# Patient Record
Sex: Female | Born: 1985 | Race: Black or African American | Hispanic: No | Marital: Single | Smoking: Current every day smoker
Health system: Southern US, Community
[De-identification: ages and names within clinical notes are randomized; demographics above are authoritative.]

## PROBLEM LIST (undated history)

## (undated) DIAGNOSIS — Z8742 Personal history of other diseases of the female genital tract: Secondary | ICD-10-CM

## (undated) HISTORY — PX: NO PAST SURGERIES: SHX2092

## (undated) HISTORY — DX: Personal history of other diseases of the female genital tract: Z87.42

## (undated) HISTORY — PX: CERVICAL BIOPSY  W/ LOOP ELECTRODE EXCISION: SUR135

---

## 2015-09-03 ENCOUNTER — Ambulatory Visit (INDEPENDENT_AMBULATORY_CARE_PROVIDER_SITE_OTHER): Payer: 59 | Admitting: Physician Assistant

## 2015-09-03 VITALS — BP 130/74 | HR 84 | Temp 98.4°F | Resp 18 | Ht 62.5 in | Wt 224.0 lb

## 2015-09-03 DIAGNOSIS — H6981 Other specified disorders of Eustachian tube, right ear: Secondary | ICD-10-CM

## 2015-09-03 DIAGNOSIS — L309 Dermatitis, unspecified: Secondary | ICD-10-CM

## 2015-09-03 MED ORDER — AZELASTINE HCL 0.1 % NA SOLN: 2.0000 | Freq: Two times a day (BID) | NASAL | 0 refills | Status: DC

## 2015-09-03 MED ORDER — TRIAMCINOLONE ACETONIDE 0.1 % EX CREA: 1.0000 "application " | TOPICAL_CREAM | Freq: Two times a day (BID) | CUTANEOUS | 0 refills | Status: DC

## 2015-09-03 NOTE — Patient Instructions (Addendum)
Take an oral antihistamine, like Claritin, Zyrtec or Allegra, each day. Stay as cool and dry as you can! Getting hot and sweaty will make the itching worse!    IF you received an x-ray today, you will receive an invoice from [facility]. Please contact [facility] at [phone] with questions or concerns regarding your invoice.   IF you received labwork today, you will receive an invoice from [employer]. Please contact [facility] at [phone] with questions or concerns regarding your invoice.   Our billing staff will not be able to assist you with questions regarding bills from these companies.  You will be contacted with the lab results as soon as they are available. The fastest way to get your results is to activate your My Chart account. Instructions are located on the last page of this paperwork. If you have not heard from [place] regarding the results in 2 weeks, please contact this office.

## 2015-09-03 NOTE — Progress Notes (Signed)
Patient ID: Donna Harris, female     DOB: [birth year], 30 y.o.    MRN: [id number]  PCP: No primary care provider on file.  Chief Complaint  Patient presents with  . Ear Pain    feel like has something in it x 5 days    Subjective:    HPI  Presents for evaluation of sensation of FB in RIGHT ear x 7 days.  In addition, she has some bumps on the RIGHT foot/leg.  No ear pain, but has pressure. Has some nasal congestion, runny nose, cough (non-productive). Throat feels uncomfortable. Talking louder, per others. No fever, chills. No nausea since her menstrual bleed (nausea is a common symptom for her with menses). No coughing, headache, dizziness.  Noticed the bumps on her feet and legs on 7/31 after spending the weekend camping. No one else developed bumps. Very itchy. Has tried no treatments.   Prior to Admission medications   Medication Sig Start Date End Date Taking? Authorizing Provider  ibuprofen (ADVIL,MOTRIN) 800 MG tablet Take 800 mg by mouth every 8 (eight) hours as needed for cramping.   Yes Historical Provider, MD     Allergies  Allergen Reactions  . Morphine And Related      There are no active problems to display for this patient.    Family History  Problem Relation Age of Onset  . Cancer Mother   . Hyperlipidemia Mother   . Hypertension Mother   . Mental illness Mother   . Mental illness Sister   . Hypertension Sister   . Hyperlipidemia Sister   . Cancer Sister   . Mental illness Brother      Social History   Social History  . Marital status: Single    Spouse name: n/a  . Number of children: 0  . Years of education: college   Occupational History  . car Biomedical engineer   Social History Main Topics  . Smoking status: Current Every Day Smoker    Packs/day: 0.50    Types: Cigarettes  . Smokeless tobacco: Never Used  . Alcohol use Yes     Comment: once a month or less  . Drug use: No  . Sexual activity: Yes    Partners:  Male    Birth control/ protection: None     Comment: desires pregnancy   Other Topics Concern  . Not on file   Social History Narrative   College degree in Investment banker, corporate from [employer].   Lives alone.   Family lives in [place].        Review of Systems       Objective:  Physical Exam  Constitutional: She is oriented to person, place, and time. She appears well-developed and well-nourished. She is active and cooperative. No distress.  BP 130/74 (BP Location: Right Arm, Patient Position: Sitting, Cuff Size: Large)   Pulse 84   Temp 98.4 F (36.9 C) (Oral)   Resp 18   Ht 5' 2.5" (1.588 m)   Wt 224 lb (101.6 kg)   LMP 08/28/2015 (Exact Date)   SpO2 99%   BMI 40.32 kg/m   HENT:  Head: Normocephalic and atraumatic.  Right Ear: Hearing, tympanic membrane, external ear and ear canal normal.  Left Ear: Hearing, tympanic membrane, external ear and ear canal normal.  Nose: Mucosal edema present. No rhinorrhea. Right sinus exhibits no maxillary sinus tenderness and no frontal sinus tenderness. Left sinus exhibits no maxillary sinus tenderness and no  frontal sinus tenderness.  Mouth/Throat: Oropharynx is clear and moist and mucous membranes are normal. No oral lesions. Normal dentition. No oropharyngeal exudate.  Eyes: Conjunctivae are normal. No scleral icterus.  Neck: Normal range of motion. Neck supple. No thyromegaly present.  Cardiovascular: Normal rate, regular rhythm and normal heart sounds.   Pulses:      Radial pulses are 2+ on the right side, and 2+ on the left side.  Pulmonary/Chest: Effort normal and breath sounds normal.  Lymphadenopathy:       Head (right side): No tonsillar, no preauricular, no posterior auricular and no occipital adenopathy present.       Head (left side): No tonsillar, no preauricular, no posterior auricular and no occipital adenopathy present.    She has no cervical adenopathy.       Right: No supraclavicular adenopathy present.        Left: No supraclavicular adenopathy present.  Neurological: She is alert and oriented to person, place, and time. No sensory deficit.  Skin: Skin is warm, dry and intact. Lesion (scattered small erythematous papules, some excoriated, scattered on the feet and lower legs) noted. No rash noted. No cyanosis. Nails show no clubbing.  Psychiatric: She has a normal mood and affect. Her speech is normal and behavior is normal.             Assessment & Plan:  1. ETD (eustachian tube dysfunction), right Supportive care. Anticipatory guidance. OTC oral antihistamine + nasal spray. - azelastine (ASTELIN) 0.1 % nasal spray; Place 2 sprays into both nostrils 2 (two) times daily. Use in each nostril as directed  Dispense: 30 mL; Refill: 0  2. Dermatitis Likely bug bites, or contact dermatitis to plants. Supportive care. Anticipatory guidance. OTC oral antihistamine. Stay cool and dry. - triamcinolone cream (KENALOG) 0.1 %; Apply 1 application topically 2 (two) times daily.  Dispense: 30 g; Refill: 0   Fernande Bras, PA-C Physician Assistant-Certified [facility]

## 2015-09-10 ENCOUNTER — Emergency Department (HOSPITAL_COMMUNITY)
Admission: EM | Admit: 2015-09-10 | Discharge: 2015-09-10 | Disposition: A | Discharge status: Home / Self Care | Payer: Self-pay | Attending: Emergency Medicine | Admitting: Emergency Medicine

## 2015-09-10 ENCOUNTER — Encounter (HOSPITAL_COMMUNITY): Payer: Self-pay | Admitting: *Deleted

## 2015-09-10 DIAGNOSIS — Z791 Long term (current) use of non-steroidal anti-inflammatories (NSAID): Secondary | ICD-10-CM | POA: Insufficient documentation

## 2015-09-10 DIAGNOSIS — F17210 Nicotine dependence, cigarettes, uncomplicated: Secondary | ICD-10-CM | POA: Insufficient documentation

## 2015-09-10 DIAGNOSIS — J0110 Acute frontal sinusitis, unspecified: Secondary | ICD-10-CM | POA: Insufficient documentation

## 2015-09-10 MED ORDER — AMOXICILLIN-POT CLAVULANATE 875-125 MG PO TABS: 1.0000 | ORAL_TABLET | Freq: Two times a day (BID) | ORAL | 0 refills | Status: DC

## 2015-09-10 MED ORDER — OXYMETAZOLINE HCL 0.05 % NA SOLN: 1.0000 | Freq: Two times a day (BID) | NASAL | 0 refills | Status: DC

## 2015-09-10 MED ORDER — PSEUDOEPHEDRINE HCL 60 MG PO TABS: 60.0000 mg | ORAL_TABLET | Freq: Four times a day (QID) | ORAL | 0 refills | Status: DC | PRN

## 2015-09-10 NOTE — Discharge Instructions (Signed)
Take your medications as prescribed. Please follow up with a primary care provider from the Resource Guide provided below in 1 week if your symptoms have not improved. Please return to the Emergency Department if symptoms worsen or new onset of fever, headache, neck stiffness, visual changes, lightheadedness, dizziness, loss of hearing, difficulty breathing.

## 2015-09-10 NOTE — ED Triage Notes (Addendum)
Patient c/o nasal congestion and right ear pain x4 weeks.  [facility] nasal spray, which she states she has been using with no relief.  Patient also c/o cough, watery eyes, sinus pain and post-nasal drip.  Patient's sclera bilaterally injected.

## 2015-09-10 NOTE — ED Provider Notes (Signed)
[facility]-EMERGENCY DEPT Provider Note   CSN: [id number] Arrival date & time: 09/10/15  1620  First Provider Contact:  None    By signing my name below, I, Majel HomerPeyton Lee, attest that this documentation has been prepared under the direction and in the presence of non-physician practitioner, Melburn HakeNicole Ilhan Debenedetto, PA-C. Electronically Signed: Majel HomerPeyton Lee, Scribe. 09/10/2015. 5:47 PM.  History   Chief Complaint Chief Complaint  Patient presents with  . Otalgia  . Nasal Congestion   The history is provided by the patient. No language interpreter was used.    HPI Comments: Donna Harris is a 30 y.o. female who presents to the Emergency Department complaining of gradually worsening, right ear pain and nasal congestion that began 3.5 weeks ago and worsened today. Pt describes hearing a constant "swooshing" sound in her right ear. Pt notes she visited [facility] last week in which she was told she may have an eustachian tube dysfunction. She states she was prescribed nasal spray with no relief. She reports associated muffled hearing, nasal congestion, sinus pressure, productive cough, postnasal drip. She denies left ear pain, drainage from her right ear, rhinorrhea, pain or discharge around her eyes, chest pain, SOB, wheezing, abdominal pain, nausea, vomiting, neck stiffness or fever.   Past Medical History:  Diagnosis Date  . History of abnormal cervical Pap smear    Patient Active Problem List   Diagnosis Date Noted  . Severe obesity (BMI >= 40) (HCC) 09/03/2015   Past Surgical History:  Procedure Laterality Date  . NO PAST SURGERIES      OB History    No data available     Home Medications    Prior to Admission medications   Medication Sig Start Date End Date Taking? Authorizing Provider  amoxicillin-clavulanate (AUGMENTIN) 875-125 MG tablet Take 1 tablet by mouth every 12 (twelve) hours. 09/10/15   Barrett HenleNicole Elizabeth Kairav Russomanno, PA-C  azelastine (ASTELIN) 0.1 % nasal spray Place 2 sprays into both nostrils 2  (two) times daily. Use in each nostril as directed 09/03/15   Chelle Jeffery, PA-C  ibuprofen (ADVIL,MOTRIN) 800 MG tablet Take 800 mg by mouth every 8 (eight) hours as needed for cramping.    Historical Provider, MD  oxymetazoline (AFRIN NASAL SPRAY) 0.05 % nasal spray Place 1 spray into both nostrils 2 (two) times daily. 09/10/15   Barrett HenleNicole Elizabeth Kaitlynne Wenz, PA-C  pseudoephedrine (SUDAFED) 60 MG tablet Take 1 tablet (60 mg total) by mouth every 6 (six) hours as needed for congestion. 09/10/15   Barrett HenleNicole Elizabeth Tyrie Porzio, PA-C  triamcinolone cream (KENALOG) 0.1 % Apply 1 application topically 2 (two) times daily. 09/03/15   Porfirio Oarhelle Jeffery, PA-C   Family History Family History  Problem Relation Age of Onset  . Cancer Mother   . Hyperlipidemia Mother   . Hypertension Mother   . Mental illness Mother   . Mental illness Sister   . Hypertension Sister   . Hyperlipidemia Sister   . Cancer Sister   . Mental illness Brother     Social History Social History  Substance Use Topics  . Smoking status: Current Every Day Smoker    Packs/day: 0.50    Types: Cigarettes  . Smokeless tobacco: Never Used  . Alcohol use Yes     Comment: once a month or less   Allergies   Morphine and related  Review of Systems Review of Systems  Constitutional: Negative for fever.  HENT: Positive for congestion, ear pain (right), hearing loss, postnasal drip and sinus pressure. Negative for ear  discharge, rhinorrhea and sore throat.   Eyes: Negative for pain, discharge and redness.  Respiratory: Positive for cough. Negative for wheezing.   Cardiovascular: Negative for chest pain.  Gastrointestinal: Negative for abdominal pain, nausea and vomiting.   Physical Exam Updated Vital Signs BP 133/83 (BP Location: Left Arm)   Pulse 107   Temp 98.5 F (36.9 C) (Oral)   Resp 18   LMP 08/28/2015 (Exact Date)   SpO2 100%   Physical Exam  Constitutional: She is oriented to person, place, and time. She appears well-developed  and well-nourished.  HENT:  Head: Normocephalic and atraumatic.  Right Ear: A middle ear effusion is present.  Left Ear: Tympanic membrane normal.  No middle ear effusion.  Nose: Right sinus exhibits maxillary sinus tenderness. Left sinus exhibits maxillary sinus tenderness.  Mouth/Throat: Uvula is midline, oropharynx is clear and moist and mucous membranes are normal. No oropharyngeal exudate, posterior oropharyngeal edema, posterior oropharyngeal erythema or tonsillar abscesses.  Eyes: Conjunctivae and EOM are normal. Pupils are equal, round, and reactive to light. Right eye exhibits no discharge. Left eye exhibits no discharge. No scleral icterus.  Neck: Normal range of motion. Neck supple.  Cardiovascular: Normal rate, regular rhythm, normal heart sounds and intact distal pulses.   HR 88  Pulmonary/Chest: Effort normal and breath sounds normal. No respiratory distress. She has no wheezes. She has no rales. She exhibits no tenderness.  Abdominal: Soft. Bowel sounds are normal. There is no tenderness.  Musculoskeletal: She exhibits no edema.  Lymphadenopathy:    She has no cervical adenopathy.  Neurological: She is alert and oriented to person, place, and time.  Skin: Skin is warm and dry.  Nursing note and vitals reviewed.  ED Treatments / Results  Labs (all labs ordered are listed, but only abnormal results are displayed) Labs Reviewed - No data to display  EKG  EKG Interpretation None      Radiology No results found.  Procedures Procedures  DIAGNOSTIC STUDIES:  Oxygen Saturation is 100% on RA, normal by my interpretation.    COORDINATION OF CARE:  5:41 PM Discussed treatment plan with pt at bedside and pt agreed to plan.  Medications Ordered in ED Medications - No data to display  Initial Impression / Assessment and Plan / ED Course  I have reviewed the triage vital signs and the nursing notes.  Pertinent labs & imaging results that were available during my care  of the patient were reviewed by me and considered in my medical decision making (see chart for details).  Clinical Course    I personally performed the services described in this documentation, which was scribed in my presence. The recorded information has been reviewed and is accurate.   Final Clinical Impressions(s) / ED Diagnoses   Final diagnoses:  Acute frontal sinusitis, recurrence not specified   Patient complaining of symptoms of sinusitis with right mid ear effusion.    Moderate symptoms have been present for greater than 10 days with purulent nasal discharge and maxillary sinus pain.  Concern for acute bacterial rhinosinusitis.  Patient discharged with Augmentin.  Instructions given for warm saline nasal wash and recommendations for follow-up with primary care physician.   Discussed return precautions with pt.   New Prescriptions New Prescriptions   AMOXICILLIN-CLAVULANATE (AUGMENTIN) 875-125 MG TABLET    Take 1 tablet by mouth every 12 (twelve) hours.   OXYMETAZOLINE (AFRIN NASAL SPRAY) 0.05 % NASAL SPRAY    Place 1 spray into both nostrils 2 (two) times daily.  PSEUDOEPHEDRINE (SUDAFED) 60 MG TABLET    Take 1 tablet (60 mg total) by mouth every 6 (six) hours as needed for congestion.        Satira Sark [place], [place] 09/10/15 1823    Geoffery Lyons, MD 09/10/15 [phone]

## 2015-12-05 ENCOUNTER — Other Ambulatory Visit: Payer: Self-pay | Admitting: Otolaryngology

## 2015-12-05 DIAGNOSIS — H9319 Tinnitus, unspecified ear: Secondary | ICD-10-CM

## 2015-12-05 DIAGNOSIS — G4459 Other complicated headache syndrome: Secondary | ICD-10-CM

## 2015-12-05 DIAGNOSIS — H538 Other visual disturbances: Secondary | ICD-10-CM

## 2015-12-16 ENCOUNTER — Other Ambulatory Visit: Payer: Self-pay

## 2015-12-16 ENCOUNTER — Ambulatory Visit
Admission: RE | Admit: 2015-12-16 | Discharge: 2015-12-16 | Disposition: A | Discharge status: Home / Self Care | Payer: 59 | Source: Ambulatory Visit | Attending: Otolaryngology | Admitting: Otolaryngology

## 2015-12-16 DIAGNOSIS — H538 Other visual disturbances: Secondary | ICD-10-CM

## 2015-12-16 DIAGNOSIS — H9319 Tinnitus, unspecified ear: Secondary | ICD-10-CM

## 2015-12-16 DIAGNOSIS — G4459 Other complicated headache syndrome: Secondary | ICD-10-CM

## 2015-12-16 MED ORDER — GADOBENATE DIMEGLUMINE 529 MG/ML IV SOLN
19.0000 mL | Freq: Once | INTRAVENOUS | Status: AC | PRN
  Administered 2015-12-16: 19 mL via INTRAVENOUS

## 2016-05-23 ENCOUNTER — Emergency Department (HOSPITAL_COMMUNITY)
Admission: EM | Admit: 2016-05-23 | Discharge: 2016-05-23 | Disposition: A | Discharge status: Home / Self Care | Payer: 59 | Attending: Emergency Medicine | Admitting: Emergency Medicine

## 2016-05-23 ENCOUNTER — Encounter (HOSPITAL_COMMUNITY): Payer: Self-pay | Admitting: Emergency Medicine

## 2016-05-23 DIAGNOSIS — Z79899 Other long term (current) drug therapy: Secondary | ICD-10-CM | POA: Insufficient documentation

## 2016-05-23 DIAGNOSIS — N946 Dysmenorrhea, unspecified: Secondary | ICD-10-CM | POA: Insufficient documentation

## 2016-05-23 DIAGNOSIS — R112 Nausea with vomiting, unspecified: Secondary | ICD-10-CM | POA: Insufficient documentation

## 2016-05-23 DIAGNOSIS — F17210 Nicotine dependence, cigarettes, uncomplicated: Secondary | ICD-10-CM | POA: Insufficient documentation

## 2016-05-23 LAB — URINALYSIS, ROUTINE W REFLEX MICROSCOPIC
Bilirubin Urine: NEGATIVE
GLUCOSE, UA: NEGATIVE mg/dL
KETONES UR: NEGATIVE mg/dL
Leukocytes, UA: NEGATIVE
NITRITE: NEGATIVE
PROTEIN: 100 mg/dL — AB
Specific Gravity, Urine: 1.035 — ABNORMAL HIGH (ref 1.005–1.030)
pH: 5 (ref 5.0–8.0)

## 2016-05-23 LAB — CBC
HCT: 40.6 % (ref 36.0–46.0)
Hemoglobin: 13.6 g/dL (ref 12.0–15.0)
MCH: 27.9 pg (ref 26.0–34.0)
MCHC: 33.5 g/dL (ref 30.0–36.0)
MCV: 83.2 fL (ref 78.0–100.0)
PLATELETS: 327 10*3/uL (ref 150–400)
RBC: 4.88 MIL/uL (ref 3.87–5.11)
RDW: 15 % (ref 11.5–15.5)
WBC: 19.3 10*3/uL — AB (ref 4.0–10.5)

## 2016-05-23 LAB — COMPREHENSIVE METABOLIC PANEL
ALK PHOS: 68 U/L (ref 38–126)
ALT: 13 U/L — AB (ref 14–54)
ANION GAP: 11 (ref 5–15)
AST: 20 U/L (ref 15–41)
Albumin: 4.4 g/dL (ref 3.5–5.0)
BILIRUBIN TOTAL: 0.4 mg/dL (ref 0.3–1.2)
BUN: 14 mg/dL (ref 6–20)
CALCIUM: 9.5 mg/dL (ref 8.9–10.3)
CO2: 27 mmol/L (ref 22–32)
CREATININE: 0.85 mg/dL (ref 0.44–1.00)
Chloride: 103 mmol/L (ref 101–111)
Glucose, Bld: 119 mg/dL — ABNORMAL HIGH (ref 65–99)
Potassium: 3.2 mmol/L — ABNORMAL LOW (ref 3.5–5.1)
Sodium: 141 mmol/L (ref 135–145)
TOTAL PROTEIN: 8.9 g/dL — AB (ref 6.5–8.1)

## 2016-05-23 LAB — WET PREP, GENITAL
Clue Cells Wet Prep HPF POC: NONE SEEN
SPERM: NONE SEEN
Trich, Wet Prep: NONE SEEN
YEAST WET PREP: NONE SEEN

## 2016-05-23 LAB — I-STAT BETA HCG BLOOD, ED (MC, WL, AP ONLY)

## 2016-05-23 LAB — LIPASE, BLOOD: Lipase: 39 U/L (ref 11–51)

## 2016-05-23 MED ORDER — SODIUM CHLORIDE 0.9 % IV SOLN
1000.0000 mL | INTRAVENOUS | Status: DC
  Administered 2016-05-23: 1000 mL via INTRAVENOUS

## 2016-05-23 MED ORDER — ONDANSETRON 4 MG PO TBDP
4.0000 mg | ORAL_TABLET | Freq: Once | ORAL | Status: AC | PRN
  Administered 2016-05-23: 4 mg via ORAL
  Filled 2016-05-23: qty 1

## 2016-05-23 MED ORDER — SODIUM CHLORIDE 0.9 % IV SOLN
1000.0000 mL | Freq: Once | INTRAVENOUS | Status: AC
  Administered 2016-05-23: 1000 mL via INTRAVENOUS

## 2016-05-23 MED ORDER — KETOROLAC TROMETHAMINE 30 MG/ML IJ SOLN
30.0000 mg | Freq: Once | INTRAMUSCULAR | Status: AC
  Administered 2016-05-23: 30 mg via INTRAVENOUS
  Filled 2016-05-23: qty 1

## 2016-05-23 MED ORDER — NAPROXEN 500 MG PO TABS: 500.0000 mg | ORAL_TABLET | Freq: Two times a day (BID) | ORAL | 0 refills | Status: DC

## 2016-05-23 MED ORDER — METOCLOPRAMIDE HCL 10 MG PO TABS: 10.0000 mg | ORAL_TABLET | Freq: Four times a day (QID) | ORAL | 0 refills | Status: DC | PRN

## 2016-05-23 MED ORDER — METOCLOPRAMIDE HCL 5 MG/ML IJ SOLN
10.0000 mg | Freq: Once | INTRAMUSCULAR | Status: AC
  Administered 2016-05-23: 10 mg via INTRAVENOUS
  Filled 2016-05-23: qty 2

## 2016-05-23 MED ORDER — DIPHENHYDRAMINE HCL 50 MG/ML IJ SOLN
25.0000 mg | Freq: Once | INTRAMUSCULAR | Status: AC
  Administered 2016-05-23: 25 mg via INTRAVENOUS
  Filled 2016-05-23: qty 1

## 2016-05-23 MED ORDER — POTASSIUM CHLORIDE CRYS ER 20 MEQ PO TBCR
40.0000 meq | EXTENDED_RELEASE_TABLET | Freq: Once | ORAL | Status: AC
  Administered 2016-05-23: 40 meq via ORAL
  Filled 2016-05-23: qty 2

## 2016-05-23 MED ORDER — DIPHENHYDRAMINE HCL 25 MG PO CAPS: 25.0000 mg | ORAL_CAPSULE | Freq: Four times a day (QID) | ORAL | 0 refills | Status: DC | PRN

## 2016-05-23 MED ORDER — SODIUM CHLORIDE 0.9 % IV BOLUS (SEPSIS)
1000.0000 mL | Freq: Once | INTRAVENOUS | Status: AC
  Administered 2016-05-23: 1000 mL via INTRAVENOUS

## 2016-05-23 NOTE — ED Notes (Signed)
Bed: WA07 Expected date:  Expected time:  Means of arrival:  Comments: 

## 2016-05-23 NOTE — ED Triage Notes (Signed)
Patient c/o abd pain that radiates to her back and vomiting since Friday.

## 2016-05-23 NOTE — ED Provider Notes (Signed)
[facility]-EMERGENCY DEPT Provider Note   CSN: [id number] Arrival date & time: 05/23/16  3664     History   Chief Complaint Chief Complaint  Patient presents with  . Emesis    HPI Donna Harris is a 31 y.o. female.  HPI Patient were she had vomiting for 3 days. She reports she started her menstrual cycle 3 days ago and she had onset of lower right abdominal pain. She reports she typically has a lot of pain and vomiting with menstrual cycles. She reports for 2 years she has had to take Zofran twice daily and ibuprofen twice daily every time she has a menstrual cycle. She states this time however she has taken her Zofran in her ibuprofen but she continues to have vomiting and pain. She reports the medications don't seem to be helping this time.Volume of bleeding is normal. No increased no increased clots. No fevers. No diarrhea. No pain burning or urgency of urination. Past Medical History:  Diagnosis Date  . History of abnormal cervical Pap smear     Patient Active Problem List   Diagnosis Date Noted  . Severe obesity (BMI >= 40) (HCC) 09/03/2015    Past Surgical History:  Procedure Laterality Date  . NO PAST SURGERIES      OB History    No data available       Home Medications    Prior to Admission medications   Medication Sig Start Date End Date Taking? Authorizing Provider  ibuprofen (ADVIL,MOTRIN) 800 MG tablet Take 800 mg by mouth every 8 (eight) hours as needed for cramping.   Yes Historical Provider, MD  ondansetron (ZOFRAN-ODT) 8 MG disintegrating tablet Take 8 mg by mouth every 8 (eight) hours as needed. for nausea 04/16/16  Yes Historical Provider, MD  diphenhydrAMINE (BENADRYL) 25 mg capsule Take 1 capsule (25 mg total) by mouth every 6 (six) hours as needed. 05/23/16   Arby Barrette, MD  metoCLOPramide (REGLAN) 10 MG tablet Take 1 tablet (10 mg total) by mouth every 6 (six) hours as needed for nausea or vomiting (nausea/headache). Take with 25 mg of Benadryl  05/23/16   Arby Barrette, MD  naproxen (NAPROSYN) 500 MG tablet Take 1 tablet (500 mg total) by mouth 2 (two) times daily. 05/23/16   Arby Barrette, MD    Family History Family History  Problem Relation Age of Onset  . Cancer Mother   . Hyperlipidemia Mother   . Hypertension Mother   . Mental illness Mother   . Mental illness Sister   . Hypertension Sister   . Hyperlipidemia Sister   . Cancer Sister   . Mental illness Brother     Social History Social History  Substance Use Topics  . Smoking status: Current Every Day Smoker    Packs/day: 0.50    Types: Cigarettes  . Smokeless tobacco: Never Used  . Alcohol use Yes     Comment: once a month or less     Allergies   Morphine and related   Review of Systems Review of Systems 10 Systems reviewed and are negative for acute change except as noted in the HPI.   Physical Exam Updated Vital Signs BP 110/72   Pulse 65   Temp 98.1 F (36.7 C) (Oral)   Resp 16   LMP 05/21/2016   SpO2 100%   Physical Exam  Constitutional: She is oriented to person, place, and time. She appears well-developed and well-nourished. No distress.  Patient is alert and nontoxic. No acute distress.  Morbid  obesity.  HENT:  Head: Normocephalic and atraumatic.  Eyes: Conjunctivae and EOM are normal.  Neck: Neck supple.  Cardiovascular: Normal rate and regular rhythm.   No murmur heard. Pulmonary/Chest: Effort normal and breath sounds normal. No respiratory distress.  Abdominal: Soft. Bowel sounds are normal. She exhibits no distension. There is no tenderness.  Mild right lower quadrant and suprapubic tenderness. With deep palpation and fluid wave generation from the left patient does not have pain or peritoneal signs referring to RLQ  Genitourinary:  Genitourinary Comments: Normal external female genitalia. Speculum exam: Moderate vaginal blood in the vault. Small amount of bleeding from cervix. No large clots or brisk bleeding. Cervix is  nonfriable and nontender. Local tenderness to the right ovary. No significant palpable enlargement. Left adnexa nontender  Musculoskeletal: She exhibits no edema.  Neurological: She is alert and oriented to person, place, and time. No cranial nerve deficit. She exhibits normal muscle tone. Coordination normal.  Skin: Skin is warm and dry.  Psychiatric: She has a normal mood and affect.  Nursing note and vitals reviewed.    ED Treatments / Results  Labs (all labs ordered are listed, but only abnormal results are displayed) Labs Reviewed  WET PREP, GENITAL - Abnormal; Notable for the following:       Result Value   WBC, Wet Prep HPF POC RARE (*)    All other components within normal limits  COMPREHENSIVE METABOLIC PANEL - Abnormal; Notable for the following:    Potassium 3.2 (*)    Glucose, Bld 119 (*)    Total Protein 8.9 (*)    ALT 13 (*)    All other components within normal limits  CBC - Abnormal; Notable for the following:    WBC 19.3 (*)    All other components within normal limits  URINALYSIS, ROUTINE W REFLEX MICROSCOPIC - Abnormal; Notable for the following:    Color, Urine AMBER (*)    APPearance TURBID (*)    Specific Gravity, Urine 1.035 (*)    Hgb urine dipstick MODERATE (*)    Protein, ur 100 (*)    Bacteria, UA MANY (*)    Squamous Epithelial / LPF 0-5 (*)    All other components within normal limits  LIPASE, BLOOD  I-STAT BETA HCG BLOOD, ED ([facility], [facility], [facility] ONLY)  GC/CHLAMYDIA PROBE AMP ([facility]) NOT AT [facility]    EKG  EKG Interpretation None       Radiology No results found.  Procedures Procedures (including critical care time)  Medications Ordered in ED Medications  0.9 %  sodium chloride infusion (1,000 mLs Intravenous New Bag/Given 05/23/16 1231)    Followed by  0.9 %  sodium chloride infusion (1,000 mLs Intravenous New Bag/Given 05/23/16 1231)  ondansetron (ZOFRAN-ODT) disintegrating tablet 4 mg (4 mg Oral Given 05/23/16 1008)  ketorolac  (TORADOL) 30 MG/ML injection 30 mg (30 mg Intravenous Given 05/23/16 1331)  potassium chloride SA (K-DUR,KLOR-CON) CR tablet 40 mEq (40 mEq Oral Given 05/23/16 1330)  sodium chloride 0.9 % bolus 1,000 mL (0 mLs Intravenous Stopped 05/23/16 1523)  metoCLOPramide (REGLAN) injection 10 mg (10 mg Intravenous Given 05/23/16 1524)  diphenhydrAMINE (BENADRYL) injection 25 mg (25 mg Intravenous Given 05/23/16 1524)     Initial Impression / Assessment and Plan / ED Course  I have reviewed the triage vital signs and the nursing notes.  Pertinent labs & imaging results that were available during my care of the patient were reviewed by me and considered in my  medical decision making (see chart for details).     Final Clinical Impressions(s) / ED Diagnoses   Final diagnoses:  Non-intractable vomiting with nausea, unspecified vomiting type  Dysmenorrhea  Patient reports she routinely has significant nausea and abdominal pain with menstrual cycle. Baseline she takes ibuprofen and Zofran daily menstrual cycle. She reports the symptoms usually peak around the third day. At this time, abdominal examination is not suggestive of appendicitis or surgical etiology. There are no peritoneal signs and on pelvic examination discomfort localizes on the bimanual examination to the right adnexa but is not severe. No sign of cervicitis, no discharge and no cervical motion tenderness. Patient has been rehydrated and treated for nausea. Patient instructed to follow-up with her gynecologist within 2 days and return precautions are reviewed.  New Prescriptions New Prescriptions   DIPHENHYDRAMINE (BENADRYL) 25 MG CAPSULE    Take 1 capsule (25 mg total) by mouth every 6 (six) hours as needed.   METOCLOPRAMIDE (REGLAN) 10 MG TABLET    Take 1 tablet (10 mg total) by mouth every 6 (six) hours as needed for nausea or vomiting (nausea/headache). Take with 25 mg of Benadryl   NAPROXEN (NAPROSYN) 500 MG TABLET    Take 1 tablet (500 mg  total) by mouth 2 (two) times daily.     Arby Barrette, MD 05/23/16 [id number]

## 2016-05-24 LAB — GC/CHLAMYDIA PROBE AMP (~~LOC~~) NOT AT ARMC
Chlamydia: NEGATIVE
Neisseria Gonorrhea: NEGATIVE

## 2016-05-26 ENCOUNTER — Emergency Department (HOSPITAL_COMMUNITY)
Admission: EM | Admit: 2016-05-26 | Discharge: 2016-05-26 | Disposition: A | Discharge status: Home / Self Care | Payer: 59 | Attending: Emergency Medicine | Admitting: Emergency Medicine

## 2016-05-26 ENCOUNTER — Emergency Department (HOSPITAL_COMMUNITY): Payer: 59

## 2016-05-26 ENCOUNTER — Encounter (HOSPITAL_COMMUNITY): Payer: Self-pay

## 2016-05-26 DIAGNOSIS — F17210 Nicotine dependence, cigarettes, uncomplicated: Secondary | ICD-10-CM | POA: Insufficient documentation

## 2016-05-26 DIAGNOSIS — R1031 Right lower quadrant pain: Secondary | ICD-10-CM | POA: Diagnosis present

## 2016-05-26 DIAGNOSIS — R112 Nausea with vomiting, unspecified: Secondary | ICD-10-CM | POA: Diagnosis not present

## 2016-05-26 DIAGNOSIS — N920 Excessive and frequent menstruation with regular cycle: Secondary | ICD-10-CM | POA: Diagnosis not present

## 2016-05-26 LAB — COMPREHENSIVE METABOLIC PANEL
ALK PHOS: 68 U/L (ref 38–126)
ALT: 12 U/L — AB (ref 14–54)
AST: 18 U/L (ref 15–41)
Albumin: 4.6 g/dL (ref 3.5–5.0)
Anion gap: 14 (ref 5–15)
BILIRUBIN TOTAL: 0.7 mg/dL (ref 0.3–1.2)
BUN: 15 mg/dL (ref 6–20)
CALCIUM: 9.3 mg/dL (ref 8.9–10.3)
CO2: 26 mmol/L (ref 22–32)
CREATININE: 0.99 mg/dL (ref 0.44–1.00)
Chloride: 96 mmol/L — ABNORMAL LOW (ref 101–111)
GFR calc Af Amer: >60 mL/min (ref >60)
GFR calc non Af Amer: >60 mL/min (ref >60)
Glucose, Bld: 118 mg/dL — ABNORMAL HIGH (ref 65–99)
Potassium: 2.8 mmol/L — ABNORMAL LOW (ref 3.5–5.1)
Sodium: 136 mmol/L (ref 135–145)
TOTAL PROTEIN: 9.3 g/dL — AB (ref 6.5–8.1)

## 2016-05-26 LAB — URINALYSIS, ROUTINE W REFLEX MICROSCOPIC
Bilirubin Urine: NEGATIVE
GLUCOSE, UA: NEGATIVE mg/dL
KETONES UR: 80 mg/dL — AB
Leukocytes, UA: NEGATIVE
Nitrite: NEGATIVE
PROTEIN: 100 mg/dL — AB
Specific Gravity, Urine: 1.031 — ABNORMAL HIGH (ref 1.005–1.030)
pH: 5 (ref 5.0–8.0)

## 2016-05-26 LAB — CBC
HCT: 41 % (ref 36.0–46.0)
HEMOGLOBIN: 13.7 g/dL (ref 12.0–15.0)
MCH: 27.6 pg (ref 26.0–34.0)
MCHC: 33.4 g/dL (ref 30.0–36.0)
MCV: 82.7 fL (ref 78.0–100.0)
PLATELETS: 335 10*3/uL (ref 150–400)
RBC: 4.96 MIL/uL (ref 3.87–5.11)
RDW: 14.6 % (ref 11.5–15.5)
WBC: 19.5 10*3/uL — ABNORMAL HIGH (ref 4.0–10.5)

## 2016-05-26 LAB — LIPASE, BLOOD: Lipase: 24 U/L (ref 11–51)

## 2016-05-26 LAB — I-STAT BETA HCG BLOOD, ED (MC, WL, AP ONLY): I-stat hCG, quantitative: <5 m[IU]/mL (ref <5)

## 2016-05-26 MED ORDER — METOCLOPRAMIDE HCL 5 MG/ML IJ SOLN
10.0000 mg | Freq: Once | INTRAMUSCULAR | Status: AC
  Administered 2016-05-26: 10 mg via INTRAVENOUS
  Filled 2016-05-26: qty 2

## 2016-05-26 MED ORDER — KETOROLAC TROMETHAMINE 30 MG/ML IJ SOLN
30.0000 mg | Freq: Once | INTRAMUSCULAR | Status: AC
  Administered 2016-05-26: 30 mg via INTRAVENOUS
  Filled 2016-05-26: qty 1

## 2016-05-26 MED ORDER — PROMETHAZINE HCL 25 MG PO TABS
25.0000 mg | ORAL_TABLET | Freq: Once | ORAL | Status: AC
  Administered 2016-05-26: 25 mg via ORAL
  Filled 2016-05-26: qty 1

## 2016-05-26 MED ORDER — PROMETHAZINE HCL 25 MG PO TABS: 25.0000 mg | ORAL_TABLET | Freq: Four times a day (QID) | ORAL | 0 refills | Status: DC | PRN

## 2016-05-26 MED ORDER — SODIUM CHLORIDE 0.9 % IV SOLN
Freq: Once | INTRAVENOUS | Status: AC
  Administered 2016-05-26: 12:00:00 via INTRAVENOUS
  Filled 2016-05-26: qty 1000

## 2016-05-26 MED ORDER — SODIUM CHLORIDE 0.9 % IV BOLUS (SEPSIS)
1000.0000 mL | Freq: Once | INTRAVENOUS | Status: AC
  Administered 2016-05-26: 1000 mL via INTRAVENOUS

## 2016-05-26 NOTE — ED Provider Notes (Signed)
[facility]-EMERGENCY DEPT Provider Note   CSN: [id number] Arrival date & time: 05/26/16  4098     History   Chief Complaint Chief Complaint  Patient presents with  . Emesis  . Abdominal Pain    HPI Donna Harris is a 31 y.o. female.  HPI  31 y.o. female presents to the Emergency Department today complaining of N/V with abdominal pain since Saturday. Seen on 05-23-16 for same. Per chart review, pt started her menstrual cycle when symptoms began. Notes pain in RLQ. Notes hx of same with RLQ pain and emesis with menses for the past 2 years. Denies new symptoms from previous ED visit. States that she did not get relief with Reglan and benadryl at home. She is on day 5 of 7 of menstrual cycle. Notes she had symptoms similar to this last month. No CP/SOB. No fevers. No vaginal discharge. Notes continued vaginal bleeding, which is normal amount for her. Pain is unchanged from previous. No other symptoms noted.   Past Medical History:  Diagnosis Date  . History of abnormal cervical Pap smear     Patient Active Problem List   Diagnosis Date Noted  . Severe obesity (BMI >= 40) (HCC) 09/03/2015    Past Surgical History:  Procedure Laterality Date  . NO PAST SURGERIES      OB History    No data available       Home Medications    Prior to Admission medications   Medication Sig Start Date End Date Taking? Authorizing Provider  diphenhydrAMINE (BENADRYL) 25 mg capsule Take 1 capsule (25 mg total) by mouth every 6 (six) hours as needed. 05/23/16   Arby Barrette, MD  ibuprofen (ADVIL,MOTRIN) 800 MG tablet Take 800 mg by mouth every 8 (eight) hours as needed for cramping.    Historical Provider, MD  metoCLOPramide (REGLAN) 10 MG tablet Take 1 tablet (10 mg total) by mouth every 6 (six) hours as needed for nausea or vomiting (nausea/headache). Take with 25 mg of Benadryl 05/23/16   Arby Barrette, MD  naproxen (NAPROSYN) 500 MG tablet Take 1 tablet (500 mg total) by mouth 2 (two) times daily.  05/23/16   Arby Barrette, MD  ondansetron (ZOFRAN-ODT) 8 MG disintegrating tablet Take 8 mg by mouth every 8 (eight) hours as needed. for nausea 04/16/16   Historical Provider, MD    Family History Family History  Problem Relation Age of Onset  . Cancer Mother   . Hyperlipidemia Mother   . Hypertension Mother   . Mental illness Mother   . Mental illness Sister   . Hypertension Sister   . Hyperlipidemia Sister   . Cancer Sister   . Mental illness Brother     Social History Social History  Substance Use Topics  . Smoking status: Current Every Day Smoker    Packs/day: 0.50    Types: Cigarettes  . Smokeless tobacco: Never Used  . Alcohol use Yes     Comment: once a month or less     Allergies   Morphine and related   Review of Systems Review of Systems ROS reviewed and all are negative for acute change except as noted in the HPI.  Physical Exam Updated Vital Signs BP (!) 121/91 (BP Location: Left Arm)   Pulse (!) 107   Temp 98.3 F (36.8 C) (Oral)   Resp 20   LMP 05/21/2016   SpO2 100%   Physical Exam  Constitutional: She is oriented to person, place, and time. Vital signs are  normal. She appears well-developed and well-nourished.  Actively vomiting. Otherwise NAD  HENT:  Head: Normocephalic and atraumatic.  Right Ear: Hearing normal.  Left Ear: Hearing normal.  Eyes: Conjunctivae and EOM are normal. Pupils are equal, round, and reactive to light.  Neck: Normal range of motion. Neck supple.  Cardiovascular: Normal rate, regular rhythm, normal heart sounds and intact distal pulses.   Pulmonary/Chest: Effort normal and breath sounds normal.  Abdominal: Soft. Normal appearance and bowel sounds are normal. There is generalized tenderness and tenderness in the right lower quadrant and suprapubic area. There is no rigidity, no rebound, no guarding, no CVA tenderness, no tenderness at McBurney's point and negative Murphy's sign.  Abdomen soft  Musculoskeletal: Normal  range of motion.  Neurological: She is alert and oriented to person, place, and time.  Skin: Skin is warm and dry.  Psychiatric: She has a normal mood and affect. Her speech is normal and behavior is normal. Thought content normal.  Nursing note and vitals reviewed.    ED Treatments / Results  Labs (all labs ordered are listed, but only abnormal results are displayed) Labs Reviewed  COMPREHENSIVE METABOLIC PANEL - Abnormal; Notable for the following:       Result Value   Potassium 2.8 (*)    Chloride 96 (*)    Glucose, Bld 118 (*)    Total Protein 9.3 (*)    ALT 12 (*)    All other components within normal limits  CBC - Abnormal; Notable for the following:    WBC 19.5 (*)    All other components within normal limits  URINALYSIS, ROUTINE W REFLEX MICROSCOPIC - Abnormal; Notable for the following:    Color, Urine AMBER (*)    APPearance HAZY (*)    Specific Gravity, Urine 1.031 (*)    Hgb urine dipstick MODERATE (*)    Ketones, ur 80 (*)    Protein, ur 100 (*)    Bacteria, UA FEW (*)    Squamous Epithelial / LPF 0-5 (*)    All other components within normal limits  LIPASE, BLOOD  I-STAT BETA HCG BLOOD, ED ([facility], [facility], [facility] ONLY)    EKG  EKG Interpretation None       Radiology US Transvaginal Non-ob  Result Date: 05/26/2016 CLINICAL DATA:  Pelvic pain for 2 days with nausea. EXAM: TRANSABDOMINAL AND TRANSVAGINAL ULTRASOUND OF PELVIS DOPPLER ULTRASOUND OF OVARIES TECHNIQUE: Both transabdominal and transvaginal ultrasound examinations of the pelvis were performed. Transabdominal technique was performed for global imaging of the pelvis including uterus, ovaries, adnexal regions, and pelvic cul-de-sac. It was necessary to proceed with endovaginal exam following the transabdominal exam to visualize the endometrium. Color and duplex Doppler ultrasound was utilized to evaluate blood flow to the ovaries. COMPARISON:  None. FINDINGS: Uterus Measurements: 7.8 by 3.9 by 5.5 cm. There 2  subserosal anterior uterine body fibroids, 1 closer to the fundus measuring 1.0 by 0.8 by 1.5 cm, and 1 closer to the lower uterine segment measuring 1.6 by 1.0 by 1.4 cm. Both of these are mildly hypoechoic compared to the myometrium. Endometrium Thickness: 2 mm.  No focal abnormality visualized. Right ovary Measurements: 2.4 by 2.0 by 2.0 cm. Normal appearance/no adnexal mass. Left ovary Measurements: 3.0 by 1.9 by 1.6 cm. Normal appearance/no adnexal mass. Pulsed Doppler evaluation of both ovaries demonstrates normal low-resistance arterial and venous waveforms. Other findings No abnormal free fluid. IMPRESSION: 1. There are 2 small subserosal anterior uterine body fibroids. 2. Otherwise normal exam.  Doppler assessment  of the ovaries normal. Electronically Signed   By: Gaylyn Rong M.D.   On: 05/26/2016 12:12   US Pelvis Complete  Result Date: 05/26/2016 CLINICAL DATA:  Pelvic pain for 2 days with nausea. EXAM: TRANSABDOMINAL AND TRANSVAGINAL ULTRASOUND OF PELVIS DOPPLER ULTRASOUND OF OVARIES TECHNIQUE: Both transabdominal and transvaginal ultrasound examinations of the pelvis were performed. Transabdominal technique was performed for global imaging of the pelvis including uterus, ovaries, adnexal regions, and pelvic cul-de-sac. It was necessary to proceed with endovaginal exam following the transabdominal exam to visualize the endometrium. Color and duplex Doppler ultrasound was utilized to evaluate blood flow to the ovaries. COMPARISON:  None. FINDINGS: Uterus Measurements: 7.8 by 3.9 by 5.5 cm. There 2 subserosal anterior uterine body fibroids, 1 closer to the fundus measuring 1.0 by 0.8 by 1.5 cm, and 1 closer to the lower uterine segment measuring 1.6 by 1.0 by 1.4 cm. Both of these are mildly hypoechoic compared to the myometrium. Endometrium Thickness: 2 mm.  No focal abnormality visualized. Right ovary Measurements: 2.4 by 2.0 by 2.0 cm. Normal appearance/no adnexal mass. Left ovary  Measurements: 3.0 by 1.9 by 1.6 cm. Normal appearance/no adnexal mass. Pulsed Doppler evaluation of both ovaries demonstrates normal low-resistance arterial and venous waveforms. Other findings No abnormal free fluid. IMPRESSION: 1. There are 2 small subserosal anterior uterine body fibroids. 2. Otherwise normal exam.  Doppler assessment of the ovaries normal. Electronically Signed   By: Gaylyn Rong M.D.   On: 05/26/2016 12:12   [place] Art/ven Flow Abd Pelv Doppler  Result Date: 05/26/2016 CLINICAL DATA:  Pelvic pain for 2 days with nausea. EXAM: TRANSABDOMINAL AND TRANSVAGINAL ULTRASOUND OF PELVIS DOPPLER ULTRASOUND OF OVARIES TECHNIQUE: Both transabdominal and transvaginal ultrasound examinations of the pelvis were performed. Transabdominal technique was performed for global imaging of the pelvis including uterus, ovaries, adnexal regions, and pelvic cul-de-sac. It was necessary to proceed with endovaginal exam following the transabdominal exam to visualize the endometrium. Color and duplex Doppler ultrasound was utilized to evaluate blood flow to the ovaries. COMPARISON:  None. FINDINGS: Uterus Measurements: 7.8 by 3.9 by 5.5 cm. There 2 subserosal anterior uterine body fibroids, 1 closer to the fundus measuring 1.0 by 0.8 by 1.5 cm, and 1 closer to the lower uterine segment measuring 1.6 by 1.0 by 1.4 cm. Both of these are mildly hypoechoic compared to the myometrium. Endometrium Thickness: 2 mm.  No focal abnormality visualized. Right ovary Measurements: 2.4 by 2.0 by 2.0 cm. Normal appearance/no adnexal mass. Left ovary Measurements: 3.0 by 1.9 by 1.6 cm. Normal appearance/no adnexal mass. Pulsed Doppler evaluation of both ovaries demonstrates normal low-resistance arterial and venous waveforms. Other findings No abnormal free fluid. IMPRESSION: 1. There are 2 small subserosal anterior uterine body fibroids. 2. Otherwise normal exam.  Doppler assessment of the ovaries normal. Electronically Signed   By:  Gaylyn Rong M.D.   On: 05/26/2016 12:12    Procedures Procedures (including critical care time)  Medications Ordered in ED Medications  promethazine (PHENERGAN) tablet 25 mg (not administered)  sodium chloride 0.9 % bolus 1,000 mL (1,000 mLs Intravenous New Bag/Given 05/26/16 0955)  metoCLOPramide (REGLAN) injection 10 mg (10 mg Intravenous Given 05/26/16 0955)  ketorolac (TORADOL) 30 MG/ML injection 30 mg (30 mg Intravenous Given 05/26/16 0955)  sodium chloride 0.9 % 1,000 mL with potassium chloride 80 mEq infusion ( Intravenous New Bag/Given 05/26/16 1133)   Initial Impression / Assessment and Plan / ED Course  I have reviewed the triage vital signs and the nursing  notes.  Pertinent labs & imaging results that were available during my care of the patient were reviewed by me and considered in my medical decision making (see chart for details).  Final Clinical Impressions(s) / ED Diagnoses  {I have reviewed and evaluated the relevant laboratory values.  {I have reviewed the relevant previous healthcare records.  {I obtained HPI from historian. {Patient discussed with supervising physician.  ED Course:  Assessment: Pt is a 31 y.o. female presents to the Emergency Department today complaining of N/V with abdominal pain since Saturday. Seen on 05-23-16 for same. Per chart review, pt started her menstrual cycle when symptoms began. Notes pain in RLQ. Notes hx of same with RLQ pain and emesis with menses for the past 2 years. Denies new symptoms from previous ED visit. States that she did not get relief with Reglan and benadryl at home. She is on day 5 of 7 of menstrual cycle. On exam, pt in NAD. Intermittent emesis. Nontoxic/nonseptic appearing. VSS. Afebrile. Lungs CTA. Heart RRR. Abdomen diffusely tender. Given Reglan and Fluids. CBC with leukocytosis 19.5. Likely related to active emesis. Unchanged from previous ED visit. CMP with potassium 2.8. Given IV potassium. UA unremarkable. Doubt  acute appendicitis. Pain controlled with analgesia. N/V controlled with reglan and some phenergan. Will DC home with phenergan. Discussed with supervising physician. Plan is to DC home with phenergan and close follow up to GYN. At time of discharge, Patient is in no acute distress. Vital Signs are stable. Patient is able to ambulate. Patient able to tolerate PO.    Disposition/Plan:  DC home Additional Verbal discharge instructions given and discussed with patient.  Pt Instructed to f/u with GYN in the next week for evaluation and treatment of symptoms. Return precautions given Pt acknowledges and agrees with plan  Supervising Physician Benjiman Core, MD  Final diagnoses:  Nausea and vomiting, intractability of vomiting not specified, unspecified vomiting type  Menorrhagia with regular cycle    New Prescriptions New Prescriptions   No medications on file     Audry Pili, PA-C 05/26/16 1258    Benjiman Core, MD 05/26/16 [phone]

## 2016-05-26 NOTE — Discharge Instructions (Addendum)
Please read and follow all provided instructions.  Your diagnoses today include:  1. Nausea and vomiting, intractability of vomiting not specified, unspecified vomiting type   2. Menorrhagia with regular cycle     Tests performed today include: Vital signs. See below for your results today.   Medications prescribed:  Take as prescribed   Stop taking Reglan and take Phenergan   Home care instructions:  Follow any educational materials contained in this packet.  Follow-up instructions: Please follow-up with Gynecologist for further evaluation of symptoms and treatment   Return instructions:  Please return to the Emergency Department if you do not get better, if you get worse, or new symptoms OR  - Fever (temperature greater than 101.58F)  - Bleeding that does not stop with holding pressure to the area    -Severe pain (please note that you may be more sore the day after your accident)  - Chest Pain  - Difficulty breathing  - Severe nausea or vomiting  - Inability to tolerate food and liquids  - Passing out  - Skin becoming red around your wounds  - Change in mental status (confusion or lethargy)  - New numbness or weakness    Please return if you have any other emergent concerns.  Additional Information:  Your vital signs today were: BP 118/70    Pulse 74    Temp 98.3 F (36.8 C) (Oral)    Resp 20    LMP 05/21/2016    SpO2 100%  If your blood pressure (BP) was elevated above 135/85 this visit, please have this repeated by your doctor within one month. ---------------

## 2016-05-26 NOTE — ED Triage Notes (Signed)
Pt seen 3 days ago for same.  N/V with slight abdominal pain since Saturday.  No fever.  No vaginal discharge or changes in urination.  States hands are "cramping up"  Emesis noted on arrival.

## 2016-06-15 ENCOUNTER — Ambulatory Visit (INDEPENDENT_AMBULATORY_CARE_PROVIDER_SITE_OTHER): Payer: 59 | Admitting: Family Medicine

## 2016-06-15 ENCOUNTER — Encounter: Payer: Self-pay | Admitting: Family Medicine

## 2016-06-15 VITALS — BP 131/83 | HR 115 | Temp 98.3°F | Resp 16 | Ht 62.5 in | Wt 222.0 lb

## 2016-06-15 DIAGNOSIS — E876 Hypokalemia: Secondary | ICD-10-CM

## 2016-06-15 DIAGNOSIS — Z131 Encounter for screening for diabetes mellitus: Secondary | ICD-10-CM

## 2016-06-15 DIAGNOSIS — E6609 Other obesity due to excess calories: Secondary | ICD-10-CM

## 2016-06-15 DIAGNOSIS — Z6839 Body mass index (BMI) 39.0-39.9, adult: Secondary | ICD-10-CM

## 2016-06-15 DIAGNOSIS — R110 Nausea: Secondary | ICD-10-CM | POA: Diagnosis not present

## 2016-06-15 LAB — POCT URINALYSIS DIP (MANUAL ENTRY)
BILIRUBIN UA: NEGATIVE
BILIRUBIN UA: NEGATIVE mg/dL
Blood, UA: NEGATIVE
Glucose, UA: NEGATIVE mg/dL
LEUKOCYTES UA: NEGATIVE
NITRITE UA: NEGATIVE
PH UA: 7 (ref 5.0–8.0)
Protein Ur, POC: 30 mg/dL — AB
Spec Grav, UA: 1.02 (ref 1.010–1.025)
Urobilinogen, UA: 1 E.U./dL

## 2016-06-15 MED ORDER — ONDANSETRON 8 MG PO TBDP: 8.0000 mg | ORAL_TABLET | Freq: Three times a day (TID) | ORAL | 2 refills | Status: DC | PRN

## 2016-06-15 MED ORDER — ESOMEPRAZOLE MAGNESIUM 20 MG PO PACK: 20.0000 mg | PACK | Freq: Every day | ORAL | 6 refills | Status: DC

## 2016-06-15 NOTE — Progress Notes (Signed)
Chief Complaint  Patient presents with  . Nausea    x 1 week   . Abdominal Pain    Lower abdomen pain x 1 week     HPI   Nausea and vomiting Pt reports that for one week she has felt nauseous This has been daily all day This has only happened in the past when she menstruates Patient's last menstrual period was 05/21/2016.  GERD She eats greasy foods Last night she ate a whopper from [employer] The previous night chicken quesadilla with rice She reports belching and gas She denies fevers or chills She reports that she is having BM She denies blood in her stools She states that they look like a green color She states that when she eats she has heart burn This morning she ate a steak egg and cheese from [employer] with orange She report lower abdominal pain She reports that a month ago she was throwing up She states that she has not taken any antacids She took prn zofran  Tobacco Abuse She is a smoker about 1/2 pack per day for 10 years She does not smoke marijuana She denies wheezing or shortness of breath  Past Medical History:  Diagnosis Date  . History of abnormal cervical Pap smear     Current Outpatient Prescriptions  Medication Sig Dispense Refill  . ibuprofen (ADVIL,MOTRIN) 800 MG tablet Take 800 mg by mouth every 8 (eight) hours as needed for cramping.    . diphenhydrAMINE (BENADRYL) 25 mg capsule Take 1 capsule (25 mg total) by mouth every 6 (six) hours as needed. (Patient not taking: Reported on 06/15/2016) 30 capsule 0  . esomeprazole (NEXIUM) 20 MG packet Take 20 mg by mouth daily before breakfast. 30 each 6  . metoCLOPramide (REGLAN) 10 MG tablet Take 1 tablet (10 mg total) by mouth every 6 (six) hours as needed for nausea or vomiting (nausea/headache). Take with 25 mg of Benadryl (Patient not taking: Reported on 06/15/2016) 20 tablet 0  . naproxen (NAPROSYN) 500 MG tablet Take 1 tablet (500 mg total) by mouth 2 (two) times daily. (Patient not taking:  Reported on 06/15/2016) 30 tablet 0  . ondansetron (ZOFRAN-ODT) 8 MG disintegrating tablet Take 1 tablet (8 mg total) by mouth every 8 (eight) hours as needed. for nausea 20 tablet 2  . promethazine (PHENERGAN) 25 MG tablet Take 1 tablet (25 mg total) by mouth every 6 (six) hours as needed for nausea or vomiting. (Patient not taking: Reported on 06/15/2016) 30 tablet 0   No current facility-administered medications for this visit.     Allergies:  Allergies  Allergen Reactions  . Morphine And Related Other (See Comments)    Stopped breathing    Past Surgical History:  Procedure Laterality Date  . NO PAST SURGERIES      Social History   Social History  . Marital status: Single    Spouse name: n/a  . Number of children: 0  . Years of education: college   Occupational History  . 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 Smoking status: Current Every Day Smoker    Packs/day: 0.50    Types: Cigarettes  . Smokeless tobacco: Never Used  . Alcohol use Yes     Comment: once a month or less  . Drug use: No  . Sexual activity: Yes    Partners: Male    Birth control/ protection: None     Comment: desires pregnancy  Other Topics Concern  . 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   Lives alone.   Family lives in [place].    Review of Systems  Constitutional: Negative for chills, fever and weight loss.  Eyes: Negative for blurred vision and double vision.  Respiratory: Negative for cough, shortness of breath and wheezing.   Cardiovascular: Negative for chest pain and palpitations.  Gastrointestinal: Positive for abdominal pain, heartburn and nausea. Negative for blood in stool, constipation, diarrhea, melena and vomiting.  Skin: Negative for itching and rash.  Neurological: Negative for dizziness and headaches.  Psychiatric/Behavioral: Negative for depression and hallucinations.   See  hpv  Objective: Vitals:   06/15/16 1010  BP: 131/83  Pulse: (!) 115  Resp: 16  Temp: 98.3 F (36.8 C)  TempSrc: Oral  SpO2: 99%  Weight: 222 lb (100.7 kg)  Height: 5' 2.5" (1.588 m)    Physical Exam  Constitutional: She is oriented to person, place, and time. She appears well-developed and well-nourished.  HENT:  Head: Normocephalic and atraumatic.  Eyes: Conjunctivae and EOM are normal.  Neck: Normal range of motion. Neck supple.  Cardiovascular: Normal rate, regular rhythm and normal heart sounds.   Pulmonary/Chest: Effort normal and breath sounds normal. No respiratory distress. She has no wheezes.  Abdominal: Soft. Bowel sounds are normal. She exhibits no distension and no mass. There is tenderness. There is no rebound and no guarding.  Neurological: She is alert and oriented to person, place, and time.  Skin: Skin is warm. Capillary refill takes less than 2 seconds.  Psychiatric: She has a normal mood and affect. Her behavior is normal. Judgment and thought content normal.     Assessment and Plan Soumya was seen today for nausea and abdominal pain.  Diagnoses and all orders for this visit:  Nausea without vomiting- advised pt to use nexium for the next month and avoid greasy foods and dairy -     Basic metabolic panel -     CBC -     Hemoglobin A1c -     POCT urinalysis dipstick  Hypokalemia- will recheck, since pt is back to general diet expect it to normalize -     Basic metabolic panel  Screening for diabetes mellitus Obesity -   Discussed the need to improve diet and increase exercise -     Hemoglobin A1c  Other orders -     ondansetron (ZOFRAN-ODT) 8 MG disintegrating tablet; Take 1 tablet (8 mg total) by mouth every 8 (eight) hours as needed. for nausea -     esomeprazole (NEXIUM) 20 MG packet; Take 20 mg by mouth daily before breakfast.   Will check h. Pylori in on month. Pt instructed to fast  A total of 30  minutes were spent face-to-face with  the patient during this encounter and over half of that time was spent on counseling and coordination of care.    Michale Weikel A Letita Prentiss

## 2016-06-15 NOTE — Patient Instructions (Addendum)
   IF you received an x-ray today, you will receive an invoice from [facility]. Please contact [facility] at [phone] with questions or concerns regarding your invoice.   IF you received labwork today, you will receive an invoice from [facility]. Please contact [facility] at [phone] with questions or concerns regarding your invoice.   Our billing staff will not be able to assist you with questions regarding bills from these companies.  You will be contacted with the lab results as soon as they are available. The fastest way to get your results is to activate your My Chart account. Instructions are located on the last page of this paperwork. If you have not heard from us regarding the results in 2 weeks, please contact this office.      Food Choices for Gastroesophageal Reflux Disease, Adult When you have gastroesophageal reflux disease (GERD), the foods you eat and your eating habits are very important. Choosing the right foods can help ease the discomfort of GERD. Consider working with a diet and nutrition specialist (dietitian) to help you make healthy food choices. What general guidelines should I follow? Eating plan  Choose healthy foods low in fat, such as fruits, vegetables, whole grains, low-fat dairy products, and lean meat, fish, and poultry.  Eat frequent, small meals instead of three large meals each day. Eat your meals slowly, in a relaxed setting. Avoid bending over or lying down until 2-3 hours after eating.  Limit high-fat foods such as fatty meats or fried foods.  Limit your intake of oils, butter, and shortening to less than 8 teaspoons each day.  Avoid the following: ? Foods that cause symptoms. These may be different for different people. Keep a food diary to keep track of foods that cause symptoms. ? Alcohol. ? Drinking large amounts of liquid with meals. ? Eating meals during the 2-3 hours before bed.  Cook foods using methods other  than frying. This may include baking, grilling, or broiling. Lifestyle   Maintain a healthy weight. Ask your health care provider what weight is healthy for you. If you need to lose weight, work with your health care provider to do so safely.  Exercise for at least 30 minutes on 5 or more days each week, or as told by your health care provider.  Avoid wearing clothes that fit tightly around your waist and chest.  Do not use any products that contain nicotine or tobacco, such as cigarettes and e-cigarettes. If you need help quitting, ask your health care provider.  Sleep with the head of your bed raised. Use a wedge under the mattress or blocks under the bed frame to raise the head of the bed. What foods are not recommended? The items listed may not be a complete list. Talk with your dietitian about what dietary choices are best for you. Grains Pastries or quick breads with added fat. French toast. Vegetables Deep fried vegetables. French fries. Any vegetables prepared with added fat. Any vegetables that cause symptoms. For some people this may include tomatoes and tomato products, chili peppers, onions and garlic, and horseradish. Fruits Any fruits prepared with added fat. Any fruits that cause symptoms. For some people this may include citrus fruits, such as oranges, grapefruit, pineapple, and lemons. Meats and other protein foods High-fat meats, such as fatty beef or pork, hot dogs, ribs, ham, sausage, salami and bacon. Fried meat or protein, including fried fish and fried chicken. Nuts and nut butters. Dairy Whole milk and chocolate milk. Sour   cream. Cream. Ice cream. Cream cheese. Milk shakes. Beverages Coffee and tea, with or without caffeine. Carbonated beverages. Sodas. Energy drinks. Fruit juice made with acidic fruits (such as orange or grapefruit). Tomato juice. Alcoholic drinks. Fats and oils Butter. Margarine. Shortening. Ghee. Sweets and desserts Chocolate and cocoa.  Donuts. Seasoning and other foods Pepper. Peppermint and spearmint. Any condiments, herbs, or seasonings that cause symptoms. For some people, this may include curry, hot sauce, or vinegar-based salad dressings. Summary  When you have gastroesophageal reflux disease (GERD), food and lifestyle choices are very important to help ease the discomfort of GERD.  Eat frequent, small meals instead of three large meals each day. Eat your meals slowly, in a relaxed setting. Avoid bending over or lying down until 2-3 hours after eating.  Limit high-fat foods such as fatty meat or fried foods. This information is not intended to replace advice given to you by your health care provider. Make sure you discuss any questions you have with your health care provider. Document Released: 01/18/2005 Document Revised: 01/20/2016 Document Reviewed: 01/20/2016 Elsevier Interactive Patient Education  2017 Elsevier Inc.  

## 2016-06-16 ENCOUNTER — Telehealth: Payer: Self-pay

## 2016-06-16 ENCOUNTER — Encounter: Payer: Self-pay | Admitting: Family Medicine

## 2016-06-16 LAB — HEMOGLOBIN A1C
ESTIMATED AVERAGE GLUCOSE: 117 mg/dL
HEMOGLOBIN A1C: 5.7 % — AB (ref 4.8–5.6)

## 2016-06-16 LAB — CBC
HEMATOCRIT: 36.2 % (ref 34.0–46.6)
HEMOGLOBIN: 11.8 g/dL (ref 11.1–15.9)
MCH: 27.4 pg (ref 26.6–33.0)
MCHC: 32.6 g/dL (ref 31.5–35.7)
MCV: 84 fL (ref 79–97)
Platelets: 332 10*3/uL (ref 150–379)
RBC: 4.3 x10E6/uL (ref 3.77–5.28)
RDW: 15.2 % (ref 12.3–15.4)
WBC: 12.6 10*3/uL — AB (ref 3.4–10.8)

## 2016-06-16 LAB — BASIC METABOLIC PANEL
BUN/Creatinine Ratio: 10 (ref 9–23)
BUN: 7 mg/dL (ref 6–20)
CALCIUM: 9.3 mg/dL (ref 8.7–10.2)
CHLORIDE: 100 mmol/L (ref 96–106)
CO2: 22 mmol/L (ref 18–29)
Creatinine, Ser: 0.68 mg/dL (ref 0.57–1.00)
GFR calc non Af Amer: 119 mL/min/{1.73_m2} (ref >59)
GFR, EST AFRICAN AMERICAN: 138 mL/min/{1.73_m2} (ref >59)
Glucose: 95 mg/dL (ref 65–99)
POTASSIUM: 4.3 mmol/L (ref 3.5–5.2)
Sodium: 138 mmol/L (ref 134–144)

## 2016-06-16 NOTE — Telephone Encounter (Signed)
Dr. Creta LevinStallings,  Request for Nexium was rejected because it is a nonformulary drug on patient's plan.  The formulary drugs are listed below if you would like to choose one of them... Esomeprazole, lansoprazole, omeprazole or pantoprazole. Thank you, Birdie Beveridge

## 2016-06-17 MED ORDER — ESOMEPRAZOLE MAGNESIUM 20 MG PO PACK: 20.0000 mg | PACK | Freq: Every day | ORAL | 3 refills | Status: DC

## 2016-06-17 NOTE — Telephone Encounter (Signed)
Prescribed generic prescription

## 2016-06-17 NOTE — Addendum Note (Signed)
Addended by: Collie SiadSTALLINGS, Shevon Sian A on: 06/17/2016 10:56 PM   Modules accepted: Orders

## 2016-06-23 ENCOUNTER — Telehealth: Payer: Self-pay

## 2016-06-23 NOTE — Telephone Encounter (Signed)
LMVM notifying patient that an alternate med for nexium had been called into pharmacy.   This was done after denial for cover my meds.

## 2016-07-07 ENCOUNTER — Emergency Department (HOSPITAL_COMMUNITY): Payer: 59

## 2016-07-07 ENCOUNTER — Emergency Department (HOSPITAL_COMMUNITY)
Admission: EM | Admit: 2016-07-07 | Discharge: 2016-07-07 | Disposition: A | Discharge status: Home / Self Care | Payer: 59 | Attending: Emergency Medicine | Admitting: Emergency Medicine

## 2016-07-07 ENCOUNTER — Encounter (HOSPITAL_COMMUNITY): Payer: Self-pay | Admitting: Emergency Medicine

## 2016-07-07 DIAGNOSIS — F17210 Nicotine dependence, cigarettes, uncomplicated: Secondary | ICD-10-CM | POA: Insufficient documentation

## 2016-07-07 DIAGNOSIS — R1110 Vomiting, unspecified: Secondary | ICD-10-CM | POA: Diagnosis present

## 2016-07-07 DIAGNOSIS — Z79899 Other long term (current) drug therapy: Secondary | ICD-10-CM | POA: Diagnosis not present

## 2016-07-07 DIAGNOSIS — N3000 Acute cystitis without hematuria: Secondary | ICD-10-CM | POA: Diagnosis not present

## 2016-07-07 LAB — URINALYSIS, ROUTINE W REFLEX MICROSCOPIC
Bilirubin Urine: NEGATIVE
Glucose, UA: NEGATIVE mg/dL
Ketones, ur: 20 mg/dL — AB
Nitrite: NEGATIVE
Protein, ur: 100 mg/dL — AB
Specific Gravity, Urine: >1.046 — ABNORMAL HIGH (ref 1.005–1.030)
pH: 8 (ref 5.0–8.0)

## 2016-07-07 LAB — HCG, SERUM, QUALITATIVE: Preg, Serum: NEGATIVE

## 2016-07-07 LAB — CBC
HEMATOCRIT: 39.5 % (ref 36.0–46.0)
HEMOGLOBIN: 13.6 g/dL (ref 12.0–15.0)
MCH: 28 pg (ref 26.0–34.0)
MCHC: 34.4 g/dL (ref 30.0–36.0)
MCV: 81.3 fL (ref 78.0–100.0)
Platelets: 417 10*3/uL — ABNORMAL HIGH (ref 150–400)
RBC: 4.86 MIL/uL (ref 3.87–5.11)
RDW: 14.8 % (ref 11.5–15.5)
WBC: 16.8 10*3/uL — ABNORMAL HIGH (ref 4.0–10.5)

## 2016-07-07 LAB — COMPREHENSIVE METABOLIC PANEL
ALBUMIN: 4.7 g/dL (ref 3.5–5.0)
ALT: 11 U/L — ABNORMAL LOW (ref 14–54)
ANION GAP: 14 (ref 5–15)
AST: 18 U/L (ref 15–41)
Alkaline Phosphatase: 72 U/L (ref 38–126)
BUN: 21 mg/dL — ABNORMAL HIGH (ref 6–20)
CO2: 28 mmol/L (ref 22–32)
Calcium: 10 mg/dL (ref 8.9–10.3)
Chloride: 97 mmol/L — ABNORMAL LOW (ref 101–111)
Creatinine, Ser: 1.1 mg/dL — ABNORMAL HIGH (ref 0.44–1.00)
GFR calc Af Amer: >60 mL/min (ref >60)
GFR calc non Af Amer: >60 mL/min (ref >60)
GLUCOSE: 140 mg/dL — AB (ref 65–99)
POTASSIUM: 2.8 mmol/L — AB (ref 3.5–5.1)
SODIUM: 139 mmol/L (ref 135–145)
Total Bilirubin: 0.7 mg/dL (ref 0.3–1.2)
Total Protein: 9.2 g/dL — ABNORMAL HIGH (ref 6.5–8.1)

## 2016-07-07 LAB — I-STAT CG4 LACTIC ACID, ED
LACTIC ACID, VENOUS: 1.58 mmol/L (ref 0.5–1.9)
LACTIC ACID, VENOUS: 1.17 mmol/L (ref 0.5–1.9)

## 2016-07-07 LAB — LIPASE, BLOOD: Lipase: 18 U/L (ref 11–51)

## 2016-07-07 MED ORDER — POTASSIUM CHLORIDE ER 20 MEQ PO TBCR: 20.0000 meq | EXTENDED_RELEASE_TABLET | Freq: Every day | ORAL | 0 refills | Status: AC

## 2016-07-07 MED ORDER — IOPAMIDOL (ISOVUE-300) INJECTION 61%
100.0000 mL | Freq: Once | INTRAVENOUS | Status: AC | PRN
  Administered 2016-07-07: 100 mL via INTRAVENOUS

## 2016-07-07 MED ORDER — METOCLOPRAMIDE HCL 5 MG/ML IJ SOLN
10.0000 mg | Freq: Once | INTRAMUSCULAR | Status: AC
  Administered 2016-07-07: 10 mg via INTRAVENOUS
  Filled 2016-07-07: qty 2

## 2016-07-07 MED ORDER — SODIUM CHLORIDE 0.9 % IV BOLUS (SEPSIS)
1000.0000 mL | Freq: Once | INTRAVENOUS | Status: AC
  Administered 2016-07-07: 1000 mL via INTRAVENOUS

## 2016-07-07 MED ORDER — IOPAMIDOL (ISOVUE-300) INJECTION 61%
INTRAVENOUS | Status: AC
  Filled 2016-07-07: qty 100

## 2016-07-07 MED ORDER — HYDROCODONE-ACETAMINOPHEN 5-325 MG PO TABS: 1.0000 | ORAL_TABLET | ORAL | 0 refills | Status: DC | PRN

## 2016-07-07 MED ORDER — FENTANYL CITRATE (PF) 100 MCG/2ML IJ SOLN
25.0000 ug | Freq: Once | INTRAMUSCULAR | Status: AC
  Administered 2016-07-07: 25 ug via INTRAVENOUS
  Filled 2016-07-07: qty 2

## 2016-07-07 MED ORDER — CEPHALEXIN 500 MG PO CAPS: 500.0000 mg | ORAL_CAPSULE | Freq: Two times a day (BID) | ORAL | 0 refills | Status: AC

## 2016-07-07 MED ORDER — ONDANSETRON HCL 4 MG PO TABS: 4.0000 mg | ORAL_TABLET | Freq: Three times a day (TID) | ORAL | 0 refills | Status: DC | PRN

## 2016-07-07 MED ORDER — POTASSIUM CHLORIDE CRYS ER 20 MEQ PO TBCR
40.0000 meq | EXTENDED_RELEASE_TABLET | Freq: Once | ORAL | Status: AC
Start: 2016-07-07 — End: 2016-07-07
  Administered 2016-07-07: 40 meq via ORAL
  Filled 2016-07-07: qty 2

## 2016-07-07 MED ORDER — POTASSIUM CHLORIDE 10 MEQ/100ML IV SOLN
10.0000 meq | INTRAVENOUS | Status: AC
  Administered 2016-07-07: 10 meq via INTRAVENOUS
  Filled 2016-07-07: qty 100

## 2016-07-07 NOTE — ED Notes (Addendum)
Pt doesn't want to give UA as she is bleeding.  I told her a catheter may be ordered later.

## 2016-07-07 NOTE — ED Notes (Signed)
Pt has a urine cup and is aware that a urine sample is needed.

## 2016-07-07 NOTE — ED Provider Notes (Signed)
[facility]-EMERGENCY DEPT Provider Note   CSN: [id number] Arrival date & time: 07/07/16  0935     History   Chief Complaint Chief Complaint  Patient presents with  . Emesis    HPI Donna Harris is a 31 y.o. female.  The history is provided by the patient and medical records.  Abdominal Cramping  This is a new problem. The current episode started more than 2 days ago (3 days). The problem occurs constantly. The problem has not changed since onset.Associated symptoms include abdominal pain. Pertinent negatives include no chest pain, no headaches and no shortness of breath. Nothing aggravates the symptoms. Nothing relieves the symptoms. She has tried nothing for the symptoms. The treatment provided no relief.    Past Medical History:  Diagnosis Date  . History of abnormal cervical Pap smear     Patient Active Problem List   Diagnosis Date Noted  . Severe obesity (BMI >= 40) (HCC) 09/03/2015    Past Surgical History:  Procedure Laterality Date  . NO PAST SURGERIES      OB History    No data available       Home Medications    Prior to Admission medications   Medication Sig Start Date End Date Taking? Authorizing Provider  levonorgestrel (MIRENA) 20 MCG/24HR IUD 1 each by Intrauterine route once.   Yes [provider]  naproxen (NAPROSYN) 500 MG tablet Take 1 tablet (500 mg total) by mouth 2 (two) times daily. 05/23/16  Yes Arby Barrette, MD  ondansetron (ZOFRAN-ODT) 8 MG disintegrating tablet Take 1 tablet (8 mg total) by mouth every 8 (eight) hours as needed. for nausea 06/15/16  Yes Collie Siad A, MD  diphenhydrAMINE (BENADRYL) 25 mg capsule Take 1 capsule (25 mg total) by mouth every 6 (six) hours as needed. Patient not taking: Reported on 06/15/2016 05/23/16   Arby Barrette, MD  esomeprazole (NEXIUM) 20 MG packet Take 20 mg by mouth daily before breakfast. Patient not taking: Reported on 07/07/2016 06/17/16   Doristine Bosworth, MD  metoCLOPramide (REGLAN) 10 MG  tablet Take 1 tablet (10 mg total) by mouth every 6 (six) hours as needed for nausea or vomiting (nausea/headache). Take with 25 mg of Benadryl Patient not taking: Reported on 06/15/2016 05/23/16   Arby Barrette, MD  promethazine (PHENERGAN) 25 MG tablet Take 1 tablet (25 mg total) by mouth every 6 (six) hours as needed for nausea or vomiting. Patient not taking: Reported on 06/15/2016 05/26/16   Audry Pili, PA-C    Family History Family History  Problem Relation Age of Onset  . Cancer Mother   . Hyperlipidemia Mother   . Hypertension Mother   . Mental illness Mother   . Mental illness Sister   . Hypertension Sister   . Hyperlipidemia Sister   . Cancer Sister   . Mental illness Brother     Social History Social History  Substance Use Topics  . Smoking status: Current Every Day Smoker    Packs/day: 0.50    Types: Cigarettes  . Smokeless tobacco: Never Used  . Alcohol use Yes     Comment: once a month or less     Allergies   Morphine and related   Review of Systems Review of Systems  Constitutional: Negative for chills, fatigue and fever.  HENT: Negative for congestion and rhinorrhea.   Respiratory: Negative for cough, chest tightness, shortness of breath, wheezing and stridor.   Cardiovascular: Negative for chest pain and palpitations.  Gastrointestinal: Positive for abdominal  pain, nausea and vomiting. Negative for abdominal distention, constipation and diarrhea.  Genitourinary: Positive for vaginal bleeding. Negative for dysuria, flank pain, frequency, vaginal discharge and vaginal pain.       Darker urine  Musculoskeletal: Negative for back pain, neck pain and neck stiffness.  Skin: Negative for rash and wound.  Neurological: Negative for dizziness, weakness, light-headedness, numbness and headaches.  Psychiatric/Behavioral: Negative for agitation.  All other systems reviewed and are negative.    Physical Exam Updated Vital Signs BP (!) 135/102 (BP Location:  Left Arm)   Pulse (!) 106   Temp 97.9 F (36.6 C) (Oral)   Resp 16   LMP 06/28/2016 (Within Days)   SpO2 99%   Physical Exam  Constitutional: She is oriented to person, place, and time. She appears well-developed and well-nourished. No distress.  HENT:  Head: Normocephalic and atraumatic.  Mouth/Throat: Oropharynx is clear and moist. No oropharyngeal exudate.  Eyes: Conjunctivae and EOM are normal. Pupils are equal, round, and reactive to light.  Neck: Normal range of motion. Neck supple.  Cardiovascular: Regular rhythm and normal heart sounds.   No murmur heard. Pulmonary/Chest: Effort normal and breath sounds normal. No stridor. No respiratory distress. She has no wheezes. She exhibits no tenderness.  Abdominal: Soft. Normal appearance and bowel sounds are normal. There is tenderness in the right lower quadrant and suprapubic area. There is no rigidity, no rebound, no guarding and no CVA tenderness.    Musculoskeletal: She exhibits no edema or tenderness.  Neurological: She is alert and oriented to person, place, and time. No sensory deficit. She exhibits normal muscle tone.  Skin: Skin is warm and dry. Capillary refill takes less than 2 seconds. No rash noted. She is not diaphoretic. No erythema.  Psychiatric: She has a normal mood and affect.  Nursing note and vitals reviewed.    ED Treatments / Results  Labs (all labs ordered are listed, but only abnormal results are displayed) Labs Reviewed  COMPREHENSIVE METABOLIC PANEL - Abnormal; Notable for the following:       Result Value   Potassium 2.8 (*)    Chloride 97 (*)    Glucose, Bld 140 (*)    BUN 21 (*)    Creatinine, Ser 1.10 (*)    Total Protein 9.2 (*)    ALT 11 (*)    All other components within normal limits  CBC - Abnormal; Notable for the following:    WBC 16.8 (*)    Platelets 417 (*)    All other components within normal limits  URINALYSIS, ROUTINE W REFLEX MICROSCOPIC - Abnormal; Notable for the  following:    APPearance HAZY (*)    Specific Gravity, Urine >1.046 (*)    Hgb urine dipstick MODERATE (*)    Ketones, ur 20 (*)    Protein, ur 100 (*)    Leukocytes, UA MODERATE (*)    Bacteria, UA MANY (*)    Squamous Epithelial / LPF 6-30 (*)    All other components within normal limits  CULTURE, BLOOD (ROUTINE X 2)  CULTURE, BLOOD (ROUTINE X 2)  LIPASE, BLOOD  HCG, SERUM, QUALITATIVE  I-STAT CG4 LACTIC ACID, ED  I-STAT CG4 LACTIC ACID, ED    EKG  EKG Interpretation None       Radiology Ct Abdomen Pelvis W Contrast  Result Date: 07/07/2016 CLINICAL DATA:  31 year old female with right lower quadrant and lower abdominal pain for 3 days with nausea and vomiting. IUD placement 07/01/2016 with some vaginal bleeding.  EXAM: CT ABDOMEN AND PELVIS WITH CONTRAST TECHNIQUE: Multidetector CT imaging of the abdomen and pelvis was performed using the standard protocol following bolus administration of intravenous contrast. CONTRAST:  100mL ISOVUE-300 IOPAMIDOL (ISOVUE-300) INJECTION 61% COMPARISON:  Pelvis ultrasound 05/26/2016. FINDINGS: Lower chest: Normal lung bases with minor atelectasis. No pericardial or pleural effusion. Hepatobiliary: Negative liver and gallbladder. Pancreas: Negative. Spleen: Negative. Adrenals/Urinary Tract: Normal adrenal glands. Mild renal lobulation but otherwise normal kidneys. No hydronephrosis or perinephric stranding. No hydroureter or periureteral stranding. Unremarkable urinary bladder. Several pelvic phleboliths. Stomach/Bowel: Negative rectum and sigmoid colon. Gas and fluid in the left colon and at the splenic flexure. Similar gas and fluid in the distal transverse colon. Retained stool at the hepatic flexure and in the right colon. No dilated large bowel. Diminutive or absent appendix. No pericecal inflammation. Multiple flocculation hyperdense foci in the distal small bowel, might reflect vitamin supplements or similar enteric contents. Otherwise negative  terminal ileum. Fluid-filled but nondilated small bowel loops in the pelvis. No dilated or abnormal small bowel loops in the abdomen. Small gastric hiatal hernia. Otherwise negative stomach and duodenum. No abdominal free fluid. Vascular/Lymphatic: Major arterial structures in the abdomen and pelvis appear patent and normal. Portal venous system is patent. No lymphadenopathy. Reproductive: Negative; IUD in place (series 4, image 9365). Other: No pelvic free fluid. Musculoskeletal: No osseous abnormality identified. IMPRESSION: 1. Fluid in the distal transverse and left colon such as can be seen with diarrhea, but no inflamed bowel. Appendix is diminutive or absent. 2. Small gastric hiatal hernia.  IUD in place. Electronically Signed   By: Odessa FlemingH  Hall M.D.   On: 07/07/2016 14:14    Procedures Procedures (including critical care time)  Medications Ordered in ED Medications  potassium chloride 10 mEq in 100 mL IVPB (0 mEq Intravenous Stopped 07/07/16 1346)  sodium chloride 0.9 % bolus 1,000 mL (0 mLs Intravenous Stopped 07/07/16 1657)  sodium chloride 0.9 % bolus 1,000 mL (0 mLs Intravenous Stopped 07/07/16 1657)  metoCLOPramide (REGLAN) injection 10 mg (10 mg Intravenous Given 07/07/16 1236)  potassium chloride SA (K-DUR,KLOR-CON) CR tablet 40 mEq (40 mEq Oral Given 07/07/16 1245)  fentaNYL (SUBLIMAZE) injection 25 mcg (25 mcg Intravenous Given 07/07/16 1236)  iopamidol (ISOVUE-300) 61 % injection 100 mL (100 mLs Intravenous Contrast Given 07/07/16 1334)     Initial Impression / Assessment and Plan / ED Course  I have reviewed the triage vital signs and the nursing notes.  Pertinent labs & imaging results that were available during my care of the patient were reviewed by me and considered in my medical decision making (see chart for details).     Nonda Lounjile K Aldape is a 31 y.o. female with a past medical history significant for Mirena placement 7 days ago who presents with nausea, vomiting, and abdominal pain. Patient  says that for the last 3 days, she has had severe abdominal pain. She describes in her lower abdomen and right lower quadrant. She says it is a 7 out of 10 severity and cramping. She says she has had nausea and vomiting, for the last 3 days and has had no oral intake. She reports feeling dehydrated and her lips are dry. She denies any fevers, chills, chest pain, shortness of breath, constipation, diarrhea, or dysuria. She does report she has had vaginal bleeding since the Mirena placement and was told this was normal. She denies it being heavy. She denies any recent traumas. She says she has taken over-the-counter pain medication without relief.  Patient says that the pain continued to worsen and she has had no oral intake.  History and exam are seen above. On exam, patient has tenderness to right lower quadrant and pubic areas. Patient has normal bowel sounds. Patient's lungs were clear and chest was nontender. Patient has no focal neurologic deficits.  Given patient's lower abdominal pain, and intolerance of oral intake, she'll be given pain medicine, nausea medicine, and fluids. Patient also have lab testing and imaging to further evaluate for etiologies of symptoms. As patient has a little leukocytosis of 16.8, patient will have CT with contrast to look for appendicitis. If this is unremarkable, suspect patient will need ultrasound to look for complication of Mirena placement.  Diagnostic testing results are seen above. Patient has evidence of urinary tract infection. Patient also hypokalemia. Lactic acid normal 2. Negative pregnancy test. For hypokalemia, patient given both IV and oral potassium. Patient had mild leukocytosis. Given the UTI, suspect urinary tract infection causing symptoms. Next  CT scan showed evidence of diarrhea but no evidence of acute surgical abnormality. Importantly, IUD appears to be in place.  Patient informed of her findings. Patient was feeling better on reassessment.  Patient will be given pressure for antibiotics, potassium, pain medicine, nausea medicine. Patient instructed to follow-up with her PCP in the next several days for reassessment. Patient also given strict return precautions for any new or worsened symptoms. Patient was able to tolerate food and fluids without difficulty prior to discharge. Patient felt better and was discharged in good condition.   Final Clinical Impressions(s) / ED Diagnoses   Final diagnoses:  Acute cystitis without hematuria    New Prescriptions Discharge Medication List as of 07/07/2016  4:43 PM    START taking these medications   Details  cephALEXin (KEFLEX) 500 MG capsule Take 1 capsule (500 mg total) by mouth 2 (two) times daily., Starting Wed 07/07/2016, Until Wed 07/14/2016, Print    HYDROcodone-acetaminophen (NORCO/VICODIN) 5-325 MG tablet Take 1 tablet by mouth every 4 (four) hours as needed., Starting Wed 07/07/2016, Print    ondansetron (ZOFRAN) 4 MG tablet Take 1 tablet (4 mg total) by mouth every 8 (eight) hours as needed for nausea or vomiting., Starting Wed 07/07/2016, Print    potassium chloride 20 MEQ TBCR Take 20 mEq by mouth daily., Starting Wed 07/07/2016, Until Wed 07/14/2016, Print        Clinical Impression: 1. Acute cystitis without hematuria     Disposition: Discharge  Condition: Good  I have discussed the results, Dx and Tx plan with the pt(& family if present). He/she/they expressed understanding and agree(s) with the plan. Discharge instructions discussed at great length. Strict return precautions discussed and pt &/or family have verbalized understanding of the instructions. No further questions at time of discharge.    Discharge Medication List as of 07/07/2016  4:43 PM    START taking these medications   Details  cephALEXin (KEFLEX) 500 MG capsule Take 1 capsule (500 mg total) by mouth 2 (two) times daily., Starting Wed 07/07/2016, Until Wed 07/14/2016, Print    HYDROcodone-acetaminophen  (NORCO/VICODIN) 5-325 MG tablet Take 1 tablet by mouth every 4 (four) hours as needed., Starting Wed 07/07/2016, Print    ondansetron (ZOFRAN) 4 MG tablet Take 1 tablet (4 mg total) by mouth every 8 (eight) hours as needed for nausea or vomiting., Starting Wed 07/07/2016, Print    potassium chloride 20 MEQ TBCR Take 20 mEq by mouth daily., Starting Wed 07/07/2016, Until Wed 07/14/2016, Print  Follow Up: [facility] [address] [place] [address] [phone] Schedule an appointment as soon as possible for a visit    [facility] DEPT [address] [id number] mc [place] [address] [phone]  If symptoms worsen     Juanisha Bautch, Canary Brim, MD 07/07/16 2031

## 2016-07-07 NOTE — Discharge Instructions (Signed)
Please schedule a follow-up appointment with a primary care physician. Please take your antibiotics for your urinary tract infection. Please take your pain medicine and nausea medicine as needed for ear discomfort. Please use the potassium supplementation for your low potassium. If any symptoms change or worsen, please return to the nearest emergency department.

## 2016-07-07 NOTE — ED Notes (Signed)
Patient transported to CT 

## 2016-07-07 NOTE — ED Triage Notes (Addendum)
Pt c/o abdominal pain and emesis x 3 days. Denies diarrhea. Pt attempted Zofran at 0700, no relief.

## 2016-07-12 LAB — CULTURE, BLOOD (ROUTINE X 2)
Culture: NO GROWTH
SPECIAL REQUESTS: ADEQUATE

## 2016-08-24 ENCOUNTER — Encounter: Payer: Self-pay | Admitting: Family Medicine

## 2016-08-24 ENCOUNTER — Ambulatory Visit (INDEPENDENT_AMBULATORY_CARE_PROVIDER_SITE_OTHER): Payer: 59 | Admitting: Family Medicine

## 2016-08-24 VITALS — BP 119/78 | HR 103 | Temp 98.2°F | Resp 17 | Ht 62.5 in | Wt 225.4 lb

## 2016-08-24 DIAGNOSIS — Z975 Presence of (intrauterine) contraceptive device: Secondary | ICD-10-CM

## 2016-08-24 DIAGNOSIS — R102 Pelvic and perineal pain: Secondary | ICD-10-CM | POA: Diagnosis not present

## 2016-08-24 DIAGNOSIS — R350 Frequency of micturition: Secondary | ICD-10-CM

## 2016-08-24 DIAGNOSIS — R300 Dysuria: Secondary | ICD-10-CM

## 2016-08-24 LAB — POCT URINALYSIS DIP (MANUAL ENTRY)
Bilirubin, UA: NEGATIVE
GLUCOSE UA: NEGATIVE mg/dL
Ketones, POC UA: NEGATIVE mg/dL
Nitrite, UA: NEGATIVE
PH UA: 7 (ref 5.0–8.0)
Protein Ur, POC: NEGATIVE mg/dL
Spec Grav, UA: 1.025 (ref 1.010–1.025)
UROBILINOGEN UA: 0.2 U/dL

## 2016-08-24 NOTE — Patient Instructions (Addendum)
     IF you received an x-ray today, you will receive an invoice from [facility]. Please contact [facility] at [phone] with questions or concerns regarding your invoice.   IF you received labwork today, you will receive an invoice from [facility]. Please contact [facility] at [phone] with questions or concerns regarding your invoice.   Our billing staff will not be able to assist you with questions regarding bills from these companies.  You will be contacted with the lab results as soon as they are available. The fastest way to get your results is to activate your My Chart account. Instructions are located on the last page of this paperwork. If you have not heard from us regarding the results in 2 weeks, please contact this office.      Pelvic Pain, Female Pelvic pain is pain in your lower abdomen, below your belly button and between your hips. The pain may start suddenly (acute), keep coming back (recurring), or last a long time (chronic). Pelvic pain that lasts longer than six months is considered chronic. Pelvic pain may affect your:  Reproductive organs.  Urinary system.  Digestive tract.  Musculoskeletal system.  There are many potential causes of pelvic pain. Sometimes, the pain can be a result of digestive or urinary conditions, strained muscles or ligaments, or even reproductive conditions. Sometimes the cause of pelvic pain is not known. Follow these instructions at home:  Take over-the-counter and prescription medicines only as told by your health care provider.  Rest as told by your health care provider.  Do not have sex it if hurts.  Keep a journal of your pelvic pain. Write down: ? When the pain started. ? Where the pain is located. ? What seems to make the pain better or worse, such as food or your menstrual cycle. ? Any symptoms you have along with the pain.  Keep all follow-up visits as told by your health care provider. This is  important. Contact a health care provider if:  Medicine does not help your pain.  Your pain comes back.  You have new symptoms.  You have abnormal vaginal discharge or bleeding, including bleeding after menopause.  You have a fever or chills.  You are constipated.  You have blood in your urine or stool.  You have foul-smelling urine.  You feel weak or lightheaded. Get help right away if:  You have sudden severe pain.  Your pain gets steadily worse.  You have severe pain along with fever, nausea, vomiting, or excessive sweating.  You lose consciousness. This information is not intended to replace advice given to you by your health care provider. Make sure you discuss any questions you have with your health care provider. Document Released: 12/16/2003 Document Revised: 02/12/2015 Document Reviewed: 11/08/2014 Elsevier Interactive Patient Education  2018 [employer]  

## 2016-08-24 NOTE — Progress Notes (Signed)
Chief Complaint  Patient presents with  . Urinary Tract Infection    possible uti, per patient pain since Mirena insertion, pain pelvis area and urinary frequency every 30 mins, bladder feels full all the time    HPI    Pt is here today for a concern about a UTI She states that she has urinary frequency of urination She states that she has has to go to the bathroom every 30 minutes to urinate She reports she also gets burning with urination  Pelvic Pain She went to Gynecology  She had an IUD mirena placed for heavy menstrual bleeding Date of IUD insertion 07/01/2016 She reports that vaginal bleeding slowed a week ago She continues to have pelvic pain She states that she feels pressure as well but that she is not sure if it is from the IUD or from the uterine fibroid. She would like to have a second opinion from Gynecology   Pt weight increased slightly since IUD placement Lab Results  Component Value Date   HGBA1C 5.7 (H) 06/15/2016  She has prediabetes She denies polydipsia or polyphagia Wt Readings from Last 3 Encounters:  08/24/16 225 lb 6.4 oz (102.2 kg)  06/15/16 222 lb (100.7 kg)  09/03/15 224 lb (101.6 kg)     Past Medical History:  Diagnosis Date  . History of abnormal cervical Pap smear     Current Outpatient Prescriptions  Medication Sig Dispense Refill  . HYDROcodone-acetaminophen (NORCO/VICODIN) 5-325 MG tablet Take 1 tablet by mouth every 4 (four) hours as needed. 10 tablet 0  . levonorgestrel (MIRENA) 20 MCG/24HR IUD 1 each by Intrauterine route once.    . naproxen (NAPROSYN) 500 MG tablet Take 1 tablet (500 mg total) by mouth 2 (two) times daily. 30 tablet 0  . ondansetron (ZOFRAN) 4 MG tablet Take 1 tablet (4 mg total) by mouth every 8 (eight) hours as needed for nausea or vomiting. 12 tablet 0  . ondansetron (ZOFRAN-ODT) 8 MG disintegrating tablet Take 1 tablet (8 mg total) by mouth every 8 (eight) hours as needed. for nausea 20 tablet 2  .  diphenhydrAMINE (BENADRYL) 25 mg capsule Take 1 capsule (25 mg total) by mouth every 6 (six) hours as needed. (Patient not taking: Reported on 06/15/2016) 30 capsule 0  . esomeprazole (NEXIUM) 20 MG packet Take 20 mg by mouth daily before breakfast. (Patient not taking: Reported on 07/07/2016) 30 each 3  . metoCLOPramide (REGLAN) 10 MG tablet Take 1 tablet (10 mg total) by mouth every 6 (six) hours as needed for nausea or vomiting (nausea/headache). Take with 25 mg of Benadryl (Patient not taking: Reported on 06/15/2016) 20 tablet 0  . potassium chloride 20 MEQ TBCR Take 20 mEq by mouth daily. 7 tablet 0  . promethazine (PHENERGAN) 25 MG tablet Take 1 tablet (25 mg total) by mouth every 6 (six) hours as needed for nausea or vomiting. (Patient not taking: Reported on 06/15/2016) 30 tablet 0   No current facility-administered medications for this visit.     Allergies:  Allergies  Allergen Reactions  . Morphine And Related Other (See Comments)    Stopped breathing    Past Surgical History:  Procedure Laterality Date  . NO PAST SURGERIES      Social History   Social History  . Marital status: Single    Spouse name: n/a  . Number of children: 0  . Years of education: college   Occupational History  . car sales     [employer]  Social History Main Topics  . Smoking status: Current Every Day Smoker    Packs/day: 0.50    Types: Cigarettes  . Smokeless tobacco: Never Used  . Alcohol use Yes     Comment: once a month or less  . Drug use: No  . Sexual activity: Yes    Partners: Male    Birth control/ protection: None     Comment: desires pregnancy   Other Topics Concern  . 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   Lives alone.   Family lives in [place].    ROS See hpi Objective: Vitals:   08/24/16 1514  BP: 119/78  Pulse: (!) 103  Resp: 17  Temp: 98.2 F (36.8 C)  TempSrc: Oral  SpO2: 100%  Weight: 225 lb 6.4 oz  (102.2 kg)  Height: 5' 2.5" (1.588 m)  Body mass index is 40.57 kg/m.   Physical Exam  Constitutional: She appears well-developed and well-nourished.  Abdominal: Soft. Bowel sounds are normal. She exhibits no distension. There is no tenderness. There is no guarding.   Vaginal exam Chaperone Present Labia normal bilaterally without skin lesions Urethral meatus normal appearing without erythema Vagina without discharge, IUD strings visualized No CMT, ovaries small and not palpable Uterus midline, nontender   Component     Latest Ref Rng & Units 08/24/2016         3:23 PM  Color, UA     yellow yellow  Clarity, UA     clear cloudy (A)  Glucose     negative mg/dL negative  Bilirubin, UA     negative negative  Specific Gravity, UA     1.010 - 1.025 1.025  RBC, UA     negative moderate (A)  pH, UA     5.0 - 8.0 7.0  Protein, UA     negative mg/dL negative  Urobilinogen, UA     0.2 or 1.0 E.U./dL 0.2  Nitrite, UA     Negative Negative  Leukocytes, UA     Negative Trace (A)   Assessment and Plan Layal was seen today for urinary tract infection.  Diagnoses and all orders for this visit:  Dysuria- no Nitrites, trace LE Will get more testing -     POCT urinalysis dipstick -     Urine Culture  Urinary frequency-  Negative, will await culture -     Urine Culture  Pelvic pain in female- follow up with Gyne -     Urine Culture  IUD (intrauterine device) in place- continue with Gyne  Morbid obesity (HCC)-  Continue with weight mgmt     [place] A Mccabe Gloria

## 2016-08-25 LAB — URINE CULTURE

## 2016-10-06 ENCOUNTER — Ambulatory Visit (INDEPENDENT_AMBULATORY_CARE_PROVIDER_SITE_OTHER): Payer: 59

## 2016-10-06 ENCOUNTER — Encounter: Payer: Self-pay | Admitting: Family Medicine

## 2016-10-06 ENCOUNTER — Ambulatory Visit (INDEPENDENT_AMBULATORY_CARE_PROVIDER_SITE_OTHER): Payer: 59 | Admitting: Family Medicine

## 2016-10-06 VITALS — BP 117/77 | HR 92 | Temp 98.0°F | Resp 18 | Ht 62.5 in | Wt 226.0 lb

## 2016-10-06 DIAGNOSIS — R05 Cough: Secondary | ICD-10-CM

## 2016-10-06 DIAGNOSIS — R059 Cough, unspecified: Secondary | ICD-10-CM

## 2016-10-06 DIAGNOSIS — B349 Viral infection, unspecified: Secondary | ICD-10-CM | POA: Diagnosis not present

## 2016-10-06 MED ORDER — ALBUTEROL SULFATE HFA 108 (90 BASE) MCG/ACT IN AERS: 2.0000 | INHALATION_SPRAY | Freq: Four times a day (QID) | RESPIRATORY_TRACT | 0 refills | Status: DC | PRN

## 2016-10-06 NOTE — Progress Notes (Signed)
9/5/20182:35 PM  Donna Harris 03/20/1985, 31 y.o. female [id number]  Chief Complaint  Patient presents with  . Cough    x3weeks yellow mucus and ened up coughing so hard she threw up   . Diarrhea    this morning     HPI:   Patient is a 31 y.o. female with no significant past medical history who presents today for 3 weeks of mostly dry cough, infrequent sputum production, fits of cough that cause her to throw up, worse at night. Does not remember having a cold before this started. Used to use an inhaler occassionally as a child. She does smoke, 1/ 2 ppd. Does have occasional GERD, never tried nexium as too expensive and not covered by insurance.  Started having diarrhea this morning. Her cousin has similar symptoms.  [facility] 10/06/2016 08/24/2016 06/15/2016  Decreased Interest 0 0 0  Down, Depressed, Hopeless 0 0 0  PHQ - 2 Score 0 0 0    Allergies  Allergen Reactions  . Morphine And Related Other (See Comments)    Stopped breathing    Current Outpatient Prescriptions on File Prior to Visit  Medication Sig Dispense Refill  . HYDROcodone-acetaminophen (NORCO/VICODIN) 5-325 MG tablet Take 1 tablet by mouth every 4 (four) hours as needed. 10 tablet 0  . levonorgestrel (MIRENA) 20 MCG/24HR IUD 1 each by Intrauterine route once.    . naproxen (NAPROSYN) 500 MG tablet Take 1 tablet (500 mg total) by mouth 2 (two) times daily. 30 tablet 0  . ondansetron (ZOFRAN) 4 MG tablet Take 1 tablet (4 mg total) by mouth every 8 (eight) hours as needed for nausea or vomiting. 12 tablet 0  . ondansetron (ZOFRAN-ODT) 8 MG disintegrating tablet Take 1 tablet (8 mg total) by mouth every 8 (eight) hours as needed. for nausea 20 tablet 2  . diphenhydrAMINE (BENADRYL) 25 mg capsule Take 1 capsule (25 mg total) by mouth every 6 (six) hours as needed. (Patient not taking: Reported on 06/15/2016) 30 capsule 0  . esomeprazole (NEXIUM) 20 MG packet Take 20 mg by mouth daily before breakfast.  (Patient not taking: Reported on 07/07/2016) 30 each 3  . metoCLOPramide (REGLAN) 10 MG tablet Take 1 tablet (10 mg total) by mouth every 6 (six) hours as needed for nausea or vomiting (nausea/headache). Take with 25 mg of Benadryl (Patient not taking: Reported on 06/15/2016) 20 tablet 0  . potassium chloride 20 MEQ TBCR Take 20 mEq by mouth daily. 7 tablet 0  . promethazine (PHENERGAN) 25 MG tablet Take 1 tablet (25 mg total) by mouth every 6 (six) hours as needed for nausea or vomiting. (Patient not taking: Reported on 06/15/2016) 30 tablet 0   No current facility-administered medications on file prior to visit.     Past Medical History:  Diagnosis Date  . History of abnormal cervical Pap smear     Past Surgical History:  Procedure Laterality Date  . NO PAST SURGERIES      Social History  Substance Use Topics  . Smoking status: Current Every Day Smoker    Packs/day: 0.50    Types: Cigarettes  . Smokeless tobacco: Never Used  . Alcohol use Yes     Comment: once a month or less    Family History  Problem Relation Age of Onset  . Cancer Mother   . Hyperlipidemia Mother   . Hypertension Mother   . Mental illness Mother   . Mental illness Sister   . Hypertension  Sister   . Hyperlipidemia Sister   . Cancer Sister   . Mental illness Brother     Review of Systems  Constitutional: Negative for chills and fever.  HENT: Negative for congestion, ear pain and sore throat.   Respiratory: Positive for cough and sputum production. Negative for shortness of breath and wheezing.   Cardiovascular: Negative for chest pain, palpitations and leg swelling.  Gastrointestinal: Positive for diarrhea and vomiting. Negative for abdominal pain and nausea.     OBJECTIVE:  Blood pressure 117/77, pulse 92, temperature 98 F (36.7 C), temperature source Oral, resp. rate 18, height 5' 2.5" (1.588 m), weight 226 lb (102.5 kg), SpO2 100 %, peak flow 270 L/min.  Physical Exam  Constitutional: She is  oriented to person, place, and time and well-developed, well-nourished, and in no distress.  HENT:  Head: Normocephalic and atraumatic.  Right Ear: Hearing, tympanic membrane, external ear and ear canal normal.  Left Ear: Hearing, tympanic membrane, external ear and ear canal normal.  Mouth/Throat: Oropharynx is clear and moist.  Eyes: Pupils are equal, round, and reactive to light. EOM are normal.  Neck: Neck supple.  Cardiovascular: Normal rate, regular rhythm and normal heart sounds.  Exam reveals no gallop and no friction rub.   No murmur heard. Pulmonary/Chest: Effort normal and breath sounds normal. She has no wheezes. She has no rales.  Abdominal: Soft. Bowel sounds are normal. She exhibits no distension. There is generalized tenderness.  Lymphadenopathy:    She has no cervical adenopathy.  Neurological: She is alert and oriented to person, place, and time. Gait normal.  Skin: Skin is warm and dry.   Dg Chest 2 View  Result Date: 10/06/2016 CLINICAL DATA:  Three weeks of cough EXAM: CHEST  2 VIEW COMPARISON:  None. FINDINGS: The heart size and mediastinal contours are within normal limits. Both lungs are clear. The visualized skeletal structures are unremarkable. IMPRESSION: No active cardiopulmonary disease. Electronically Signed   By: Elige Ko   On: 10/06/2016 12:11     ASSESSMENT and PLAN:  1. Cough Normal exam and CXR. Discussed DDX, favoring viral RAD given constellation of symptoms and peak flow. Treating empirically with albuterol inhaler. Routine care instructions and precautions given. - DG Chest 2 View; Future  2. Viral syndrome See above.     Myles Lipps, MD Primary Care at [facility] [address] [place], [place] [address] Ph.  [phone] Fax [phone]

## 2016-10-06 NOTE — Patient Instructions (Addendum)
   IF you received an x-ray today, you will receive an invoice from [facility]. Please contact [facility] at [phone] with questions or concerns regarding your invoice.   IF you received labwork today, you will receive an invoice from [facility]. Please contact [facility] at [phone] with questions or concerns regarding your invoice.   Our billing staff will not be able to assist you with questions regarding bills from these companies.  You will be contacted with the lab results as soon as they are available. The fastest way to get your results is to activate your My Chart account. Instructions are located on the last page of this paperwork. If you have not heard from us regarding the results in 2 weeks, please contact this office.     How to Use a Metered Dose Inhaler A metered dose inhaler is a handheld device for taking medicine that must be breathed into the lungs (inhaled). The device can be used to deliver a variety of inhaled medicines, including:  Quick relief or rescue medicines, such as bronchodilators.  Controller medicines, such as corticosteroids.  The medicine is delivered by pushing down on a metal canister to release a preset amount of spray and medicine. Each device contains the amount of medicine that is needed for a preset number of uses (inhalations). Your health care provider may recommend that you use a spacer with your inhaler to help you take the medicine more effectively. A spacer is a plastic tube with a mouthpiece on one end and an opening that connects to the inhaler on the other end. A spacer holds the medicine in a tube for a short time, which allows you to inhale more medicine. What are the risks? If you do not use your inhaler correctly, medicine might not reach your lungs to help you breathe. Inhaler medicine can cause side effects, such as:  Mouth or throat infection.  Cough.  Hoarseness.  Headache.  Nausea and  vomiting.  Lung infection (pneumonia) in people who have a lung condition called COPD.  How to use a metered dose inhaler without a spacer 1. Remove the cap from the inhaler. 2. If you are using the inhaler for the first time, shake it for 5 seconds, turn it away from your face, then release 4 puffs into the air. This is called priming. 3. Shake the inhaler for 5 seconds. 4. Position the inhaler so the top of the canister faces up. 5. Put your index finger on the top of the medicine canister. Support the bottom of the inhaler with your thumb. 6. Breathe out normally and as completely as possible, away from the inhaler. 7. Either place the inhaler between your teeth and close your lips tightly around the mouthpiece, or hold the inhaler 1-2 inches (2.5-5 cm) away from your open mouth. Keep your tongue down out of the way. If you are unsure which technique to use, ask your health care provider. 8. Press the canister down with your index finger to release the medicine, then inhale deeply and slowly through your mouth (not your nose) until your lungs are completely filled. Inhaling should take 4-6 seconds. 9. Hold the medicine in your lungs for 5-10 seconds (10 seconds is best). This helps the medicine get into the small airways of your lungs. 10. With your lips in a tight circle (pursed), breathe out slowly. 11. Repeat steps 3-10 until you have taken the number of puffs that your health care provider directed. Wait about 1 minute   between puffs or as directed. 12. Put the cap on the inhaler. 13. If you are using a steroid inhaler, rinse your mouth with water, gargle, and spit out the water. Do not swallow the water. How to use a metered dose inhaler with a spacer 1. Remove the cap from the inhaler. 2. If you are using the inhaler for the first time, shake it for 5 seconds, turn it away from your face, then release 4 puffs into the air. This is called priming. 3. Shake the inhaler for 5  seconds. 4. Place the open end of the spacer onto the inhaler mouthpiece. 5. Position the inhaler so the top of the canister faces up and the spacer mouthpiece faces you. 6. Put your index finger on the top of the medicine canister. Support the bottom of the inhaler and the spacer with your thumb. 7. Breathe out normally and as completely as possible, away from the spacer. 8. Place the spacer between your teeth and close your lips tightly around it. Keep your tongue down out of the way. 9. Press the canister down with your index finger to release the medicine, then inhale deeply and slowly through your mouth (not your nose) until your lungs are completely filled. Inhaling should take 4-6 seconds. 10. Hold the medicine in your lungs for 5-10 seconds (10 seconds is best). This helps the medicine get into the small airways of your lungs. 11. With your lips in a tight circle (pursed), breathe out slowly. 12. Repeat steps 3-11 until you have taken the number of puffs that your health care provider directed. Wait about 1 minute between puffs or as directed. 13. Remove the spacer from the inhaler and put the cap on the inhaler. 14. If you are using a steroid inhaler, rinse your mouth with water, gargle, and spit out the water. Do not swallow the water. Follow these instructions at home:  Take your inhaled medicine only as told by your health care provider. Do not use the inhaler more than directed by your health care provider.  Keep all follow-up visits as told by your health care provider. This is important.  If your inhaler has a counter, you can check it to determine how full your inhaler is. If your inhaler does not have a counter, ask your health care provider when you will need to refill your inhaler and write the refill date on a calendar or on your inhaler canister. Note that you cannot know when an inhaler is empty by shaking it.  Follow directions on the package insert for care and cleaning of  your inhaler and spacer. Contact a health care provider if:  Symptoms are only partially relieved with your inhaler.  You are having trouble using your inhaler.  You have an increase in phlegm.  You have headaches. Get help right away if:  You feel little or no relief after using your inhaler.  You have dizziness.  You have a fast heart rate.  You have chills or a fever.  You have night sweats.  There is blood in your phlegm. Summary  A metered dose inhaler is a handheld device for taking medicine that must be breathed into the lungs (inhaled).  The medicine is delivered by pushing down on a metal canister to release a preset amount of spray and medicine.  Each device contains the amount of medicine that is needed for a preset number of uses (inhalations). This information is not intended to replace advice given to you   by your health care provider. Make sure you discuss any questions you have with your health care provider. Document Released: 01/18/2005 Document Revised: 12/09/2015 Document Reviewed: 12/09/2015 Elsevier Interactive Patient Education  2017 [employer]  

## 2016-11-03 ENCOUNTER — Other Ambulatory Visit: Payer: Self-pay | Admitting: Family Medicine

## 2016-12-04 ENCOUNTER — Other Ambulatory Visit: Payer: Self-pay | Admitting: Family Medicine

## 2017-01-20 ENCOUNTER — Encounter: Payer: Self-pay | Admitting: Physician Assistant

## 2017-01-20 ENCOUNTER — Other Ambulatory Visit: Payer: Self-pay

## 2017-01-20 ENCOUNTER — Ambulatory Visit (INDEPENDENT_AMBULATORY_CARE_PROVIDER_SITE_OTHER): Payer: 59 | Admitting: Physician Assistant

## 2017-01-20 VITALS — BP 122/68 | HR 92 | Temp 98.4°F | Resp 18 | Ht 64.17 in | Wt 233.4 lb

## 2017-01-20 DIAGNOSIS — L290 Pruritus ani: Secondary | ICD-10-CM

## 2017-01-20 DIAGNOSIS — K644 Residual hemorrhoidal skin tags: Secondary | ICD-10-CM

## 2017-01-20 DIAGNOSIS — N760 Acute vaginitis: Secondary | ICD-10-CM

## 2017-01-20 DIAGNOSIS — B9689 Other specified bacterial agents as the cause of diseases classified elsewhere: Secondary | ICD-10-CM

## 2017-01-20 DIAGNOSIS — K5900 Constipation, unspecified: Secondary | ICD-10-CM | POA: Diagnosis not present

## 2017-01-20 DIAGNOSIS — N898 Other specified noninflammatory disorders of vagina: Secondary | ICD-10-CM

## 2017-01-20 LAB — POCT URINALYSIS DIP (MANUAL ENTRY)
BILIRUBIN UA: NEGATIVE
BILIRUBIN UA: NEGATIVE mg/dL
GLUCOSE UA: NEGATIVE mg/dL
Nitrite, UA: NEGATIVE
PROTEIN UA: NEGATIVE mg/dL
SPEC GRAV UA: 1.02 (ref 1.010–1.025)
Urobilinogen, UA: 0.2 E.U./dL
pH, UA: 6 (ref 5.0–8.0)

## 2017-01-20 LAB — POCT WET + KOH PREP
TRICH BY WET PREP: ABSENT
Yeast by KOH: ABSENT
Yeast by wet prep: ABSENT

## 2017-01-20 MED ORDER — HYDROCORTISONE 2.5 % RE CREA: 1.0000 "application " | TOPICAL_CREAM | Freq: Two times a day (BID) | RECTAL | 0 refills | Status: DC

## 2017-01-20 MED ORDER — METRONIDAZOLE 500 MG PO TABS: 500.0000 mg | ORAL_TABLET | Freq: Two times a day (BID) | ORAL | 0 refills | Status: DC

## 2017-01-20 NOTE — Patient Instructions (Addendum)
Your labs show that you have BV.  I have given you a prescription for Flagyl.  Please take as prescribed.  Do not drink alcohol you are on this medication.  Below some information about bacterial vaginosis and how to prevent in the future.  If you develop future signs of BV, please return to office to see me.  In terms of your hemorrhoids, I have given you a cream to use topically.  This should help with itching.  The best way to prevent hemorrhoids in the future is to reduce constipation.  Below some information on how to reduce constipation and bowel health.  If you develop any rectal pain or bleeding, please seek care immediately.  To help reduce constipation and promote bowel health: 1. Drink at least 64 ounces of water each day 2. Eat plenty of fiber (fruits, vegetables, whole grains, legumes) 3. Be physically active or exercise including walking, jogging, swimming, yoga, etc. 4. For active constipation use a stool softener (docusate) or an osmotic laxative (like Miralax) each day, or as needed  If you have to do number 4: Please pick up Miralax for moderate to severe constipation. Take this once a day for the next 2-3 days. Please also start docusate stool softener, twice a day for at least 1 week. If stools become loose, cut down to once a day for ~1 week. If stools remain loose, cut back to 1/2 pill for ~1 week. You can stop docusate thereafter and resume as needed for constipation.    Bacterial Vaginosis Bacterial vaginosis is an infection of the vagina. It happens when too many germs (bacteria) grow in the vagina. This infection puts you at risk for infections from sex (STIs). Treating this infection can lower your risk for some STIs. You should also treat this if you are pregnant. It can cause your baby to be born early. Follow these instructions at home: Medicines  Take over-the-counter and prescription medicines only as told by your doctor.  Take or use your antibiotic medicine as  told by your doctor. Do not stop taking or using it even if you start to feel better. General instructions  If you your sexual partner is a woman, tell her that you have this infection. She needs to get treatment if she has symptoms. If you have a female partner, he does not need to be treated.  During treatment: ? Avoid sex. ? Do not douche. ? Avoid alcohol as told. ? Avoid breastfeeding as told.  Drink enough fluid to keep your pee (urine) clear or pale yellow.  Keep your vagina and butt (rectum) clean. ? Wash the area with warm water every day. ? Wipe from front to back after you use the toilet.  Keep all follow-up visits as told by your doctor. This is important. Preventing this condition  Do not douche.  Use only warm water to wash around your vagina.  Use protection when you have sex. This includes: ? Latex condoms. ? Dental dams.  Limit how many people you have sex with. It is best to only have sex with the same person (be monogamous).  Get tested for STIs. Have your partner get tested.  Wear underwear that is cotton or lined with cotton.  Avoid tight pants and pantyhose. This is most important in summer.  Do not use any products that have nicotine or tobacco in them. These include cigarettes and e-cigarettes. If you need help quitting, ask your doctor.  Do not use illegal drugs.  Limit  how much alcohol you drink. Contact a doctor if:  Your symptoms do not get better, even after you are treated.  You have more discharge or pain when you pee (urinate).  You have a fever.  You have pain in your belly (abdomen).  You have pain with sex.  Your bleed from your vagina between periods. Summary  This infection happens when too many germs (bacteria) grow in the vagina.  Treating this condition can lower your risk for some infections from sex (STIs).  You should also treat this if you are pregnant. It can cause early (premature) birth.  Do not stop taking or  using your antibiotic medicine even if you start to feel better. This information is not intended to replace advice given to you by your health care provider. Make sure you discuss any questions you have with your health care provider. Document Released: 10/28/2007 Document Revised: 10/04/2015 Document Reviewed: 10/04/2015 Elsevier Interactive Patient Education  2017 Elsevier Inc.    Hemorrhoids Hemorrhoids are swollen veins in and around the rectum or anus. Hemorrhoids can cause pain, itching, or bleeding. Most of the time, they do not cause serious problems. They usually get better with diet changes, lifestyle changes, and other home treatments. Follow these instructions at home: Eating and drinking  Eat foods that have fiber, such as whole grains, beans, nuts, fruits, and vegetables. Ask your doctor about taking products that have added fiber (fibersupplements).  Drink enough fluid to keep your pee (urine) clear or pale yellow. For Pain and Swelling  Take a warm-water bath (sitz bath) for 20 minutes to ease pain. Do this 3-4 times a day.  If directed, put ice on the painful area. It may be helpful to use ice between your warm baths. ? Put ice in a plastic bag. ? Place a towel between your skin and the bag. ? Leave the ice on for 20 minutes, 2-3 times a day. General instructions  Take over-the-counter and prescription medicines only as told by your doctor. ? Medicated creams and medicines that are inserted into the anus (suppositories) may be used or applied as told.  Exercise often.  Go to the bathroom when you have the urge to poop (to have a bowel movement). Do not wait.  Avoid pushing too hard (straining) when you poop.  Keep the butt area dry and clean. Use wet toilet paper or moist paper towels.  Do not sit on the toilet for a long time. Contact a doctor if:  You have any of these: ? Pain and swelling that do not get better with treatment or medicine. ? Bleeding  that will not stop. ? Trouble pooping or you cannot poop. ? Pain or swelling outside the area of the hemorrhoids. This information is not intended to replace advice given to you by your health care provider. Make sure you discuss any questions you have with your health care provider. Document Released: 10/28/2007 Document Revised: 06/26/2015 Document Reviewed: 10/02/2014 Elsevier Interactive Patient Education  2018 [employer]  Constipation, Adult Constipation is when a person:  Poops (has a bowel movement) fewer times in a week than normal.  Has a hard time pooping.  Has poop that is dry, hard, or bigger than normal.  Follow these instructions at home: Eating and drinking   Eat foods that have a lot of fiber, such as: ? Fresh fruits and vegetables. ? Whole grains. ? Beans.  Eat less of foods that are high in fat, low in fiber, or overly  processed, such as: ? [place] fries. ? Hamburgers. ? Cookies. ? Candy. ? Soda.  Drink enough fluid to keep your pee (urine) clear or pale yellow. General instructions  Exercise regularly or as told by your doctor.  Go to the restroom when you feel like you need to poop. Do not hold it in.  Take over-the-counter and prescription medicines only as told by your doctor. These include any fiber supplements.  Do pelvic floor retraining exercises, such as: ? Doing deep breathing while relaxing your lower belly (abdomen). ? Relaxing your pelvic floor while pooping.  Watch your condition for any changes.  Keep all follow-up visits as told by your doctor. This is important. Contact a doctor if:  You have pain that gets worse.  You have a fever.  You have not pooped for 4 days.  You throw up (vomit).  You are not hungry.  You lose weight.  You are bleeding from the anus.  You have thin, pencil-like poop (stool). Get help right away if:  You have a fever, and your symptoms suddenly get worse.  You leak poop or have blood in  your poop.  Your belly feels hard or bigger than normal (is bloated).  You have very bad belly pain.  You feel dizzy or you faint. This information is not intended to replace advice given to you by your health care provider. Make sure you discuss any questions you have with your health care provider. Document Released: 07/07/2007 Document Revised: 08/08/2015 Document Reviewed: 07/09/2015 Elsevier Interactive Patient Education  2018 [employer]  IF you received an x-ray today, you will receive an invoice from [facility]. Please contact [facility] at [phone] with questions or concerns regarding your invoice.   IF you received labwork today, you will receive an invoice from [facility]. Please contact [facility] at [phone] with questions or concerns regarding your invoice.   Our billing staff will not be able to assist you with questions regarding bills from these companies.  You will be contacted with the lab results as soon as they are available. The fastest way to get your results is to activate your My Chart account. Instructions are located on the last page of this paperwork. If you have not heard from us regarding the results in 2 weeks, please contact this office.

## 2017-01-20 NOTE — Progress Notes (Signed)
01/20/2017 at 6:41 PM  Nonda LouAnjile K Fowles / DOB: 01/19/1986 / MRN: [id number]  The patient has Severe obesity (BMI >= 40) (HCC) on their problem list.  SUBJECTIVE  Korissa Burnett CorrenteK Golightly is a 31 y.o. female who complains of vaginal and anal itching x 3 days. Notes she is typically constipated and strains a lot when she is on the toilet. Tries to have a BM every day. Denies any rectal pain, rectal bleeding, dysuria, hematuria, urinary frequency, urinary urgency, flank pain, abdominal pain, pelvic pain, cloudy malordorous urine, genital rash and vaginal discharge .  She denies eating fiber, drinking lots of fluids, and exercising.  Has hx of BV. She is sexually active with monogamous partner. Has IUD placed. LMP 12/24/16.  She  has a past medical history of History of abnormal cervical Pap smear.    Medications reviewed and updated by myself where necessary, and exist elsewhere in the encounter.   Ms. Ala DachFord is allergic to morphine and related. She  reports that she has been smoking cigarettes.  She has been smoking about 0.50 packs per day. she has never used smokeless tobacco. She reports that she drinks alcohol. She reports that she does not use drugs. She  reports that she currently engages in sexual activity and has had partners who are Female. She reports using the following method of birth control/protection: None. The patient  has a past surgical history that includes No past surgeries.  Her family history includes Cancer in her mother and sister; Hyperlipidemia in her mother and sister; Hypertension in her mother and sister; Mental illness in her brother, mother, and sister.  Review of Systems  Constitutional: Negative for chills, diaphoresis and fever.    OBJECTIVE  Her  height is 5' 4.17" (1.63 m) and weight is 233 lb 6.4 oz (105.9 kg). Her oral temperature is 98.4 F (36.9 C). Her blood pressure is 122/68 and her pulse is 92. Her respiration is 18 and oxygen saturation is 98%.  The patient's body  mass index is 39.85 kg/m.  Physical Exam  Constitutional: She is oriented to person, place, and time. She appears well-developed and well-nourished.  HENT:  Head: Normocephalic and atraumatic.  Eyes: Conjunctivae are normal.  Neck: Normal range of motion.  Pulmonary/Chest: Effort normal.  Genitourinary: There is no rash on the right labia. There is no rash on the left labia.  Genitourinary Comments: External hemorrhoid skin tag noted at 12 and 6 o clock. No thrombosed hemorrhoid noted. No tenderness with palpation of skin tag. No internal hemorrhoids palpated.   Neurological: She is alert and oriented to person, place, and time.  Skin: Skin is warm and dry.  Psychiatric: She has a normal mood and affect.  Vitals reviewed.   Results for orders placed or performed in visit on 01/20/17 (from the past 24 hour(s))  POCT urinalysis dipstick     Status: Abnormal   Collection Time: 01/20/17 11:35 AM  Result Value Ref Range   Color, UA yellow yellow   Clarity, UA clear clear   Glucose, UA negative negative mg/dL   Bilirubin, UA negative negative   Ketones, POC UA negative negative mg/dL   Spec Grav, UA 4.0981.020 1.1911.010 - 1.025   Blood, UA trace-lysed (A) negative   pH, UA 6.0 5.0 - 8.0   Protein Ur, POC negative negative mg/dL   Urobilinogen, UA 0.2 0.2 or 1.0 E.U./dL   Nitrite, UA Negative Negative   Leukocytes, UA Trace (A) Negative  POCT Wet + KOH  Prep     Status: Abnormal   Collection Time: 01/20/17 11:47 AM  Result Value Ref Range   Yeast by KOH Absent Absent   Yeast by wet prep Absent Absent   WBC by wet prep None (A) Few   Clue Cells Wet Prep HPF POC Moderate (A) None   Trich by wet prep Absent Absent   Bacteria Wet Prep HPF POC Too numerous to count  (A) Few   Epithelial Cells By [employer]et Pref ([facility]) Few None, Few, Too numerous to count   RBC,UR,HPF,POC None None RBC/hpf    ASSESSMENT & PLAN  Raechel was seen today for vaginal itching.  Diagnoses and all orders for this  visit:  Vaginal itching -     POCT urinalysis dipstick -     POCT Wet + KOH Prep  Bacterial vaginosis POCT wet prep shows moderate clue cells. Given educational material on how to prevent BV in the future. -     metroNIDAZOLE (FLAGYL) 500 MG tablet; Take 1 tablet (500 mg total) by mouth 2 (two) times daily with a meal. DO NOT CONSUME ALCOHOL WHILE TAKING THIS MEDICATION.  Anal itching  Hemorrhoidal skin tag -     hydrocortisone (ANUSOL-HC) 2.5 % rectal cream; Place 1 application rectally 2 (two) times daily.  Constipation, unspecified constipation type Given educational on how to treat constipation with lifestyle modifications and over-the-counter medication.  The patient was advised to call or come back to clinic if she does not see an improvement in symptoms, or worsens with the above plan.   Benjiman CoreBrittany Emila Steinhauser, PA-C [facility] 01/20/2017 6:41 PM

## 2017-05-19 ENCOUNTER — Encounter: Payer: Self-pay | Admitting: Physician Assistant

## 2017-05-19 ENCOUNTER — Other Ambulatory Visit: Payer: Self-pay

## 2017-05-19 ENCOUNTER — Ambulatory Visit (INDEPENDENT_AMBULATORY_CARE_PROVIDER_SITE_OTHER): Payer: 59

## 2017-05-19 ENCOUNTER — Ambulatory Visit (INDEPENDENT_AMBULATORY_CARE_PROVIDER_SITE_OTHER): Payer: 59 | Admitting: Physician Assistant

## 2017-05-19 VITALS — BP 110/68 | HR 104 | Temp 99.0°F | Resp 18 | Ht 64.37 in | Wt 233.0 lb

## 2017-05-19 DIAGNOSIS — R195 Other fecal abnormalities: Secondary | ICD-10-CM

## 2017-05-19 DIAGNOSIS — R102 Pelvic and perineal pain: Secondary | ICD-10-CM

## 2017-05-19 DIAGNOSIS — R350 Frequency of micturition: Secondary | ICD-10-CM

## 2017-05-19 LAB — POCT URINE PREGNANCY: PREG TEST UR: NEGATIVE

## 2017-05-19 LAB — POCT URINALYSIS DIP (MANUAL ENTRY)
BILIRUBIN UA: NEGATIVE mg/dL
Bilirubin, UA: NEGATIVE
GLUCOSE UA: NEGATIVE mg/dL
Leukocytes, UA: NEGATIVE
Nitrite, UA: NEGATIVE
Protein Ur, POC: NEGATIVE mg/dL
SPEC GRAV UA: 1.02 (ref 1.010–1.025)
UROBILINOGEN UA: 0.2 U/dL
pH, UA: 5.5 (ref 5.0–8.0)

## 2017-05-19 LAB — POCT CBC
Granulocyte percent: 63.7 %G (ref 37–80)
HCT, POC: 38.6 % (ref 37.7–47.9)
Hemoglobin: 12.3 g/dL (ref 12.2–16.2)
LYMPH, POC: 3.5 — AB (ref 0.6–3.4)
MCH: 26.7 pg — AB (ref 27–31.2)
MCHC: 31.8 g/dL (ref 31.8–35.4)
MCV: 83.7 fL (ref 80–97)
MID (CBC): 1 — AB (ref 0–0.9)
MPV: 7.7 fL (ref 0–99.8)
PLATELET COUNT, POC: 269 10*3/uL (ref 142–424)
POC Granulocyte: 7.8 — AB (ref 2–6.9)
POC LYMPH %: 28.4 % (ref 10–50)
POC MID %: 7.9 % (ref 0–12)
RBC: 4.61 M/uL (ref 4.04–5.48)
RDW, POC: 14.8 %
WBC: 12.3 10*3/uL — AB (ref 4.6–10.2)

## 2017-05-19 LAB — POCT WET + KOH PREP
TRICH BY WET PREP: ABSENT
YEAST BY KOH: ABSENT
Yeast by wet prep: ABSENT

## 2017-05-19 LAB — POC MICROSCOPIC URINALYSIS (UMFC): MUCUS RE: ABSENT

## 2017-05-19 LAB — GLUCOSE, POCT (MANUAL RESULT ENTRY): POC Glucose: 88 mg/dl (ref 70–99)

## 2017-05-19 NOTE — Patient Instructions (Addendum)
  We are going to send off your labs and urine for further testing. I also recommend you have another pelvic US. This should be done within the next week. In the meantime, I recommend you use warm compress to affected area. If you develop any new symptoms or worsening symptoms, please seek care immediately. Otherwise, follow up with me in office next week. Thank you for letting me participate in your health and well being.    IF you received an x-ray today, you will receive an invoice from [facility]. Please contact [facility] at [phone] with questions or concerns regarding your invoice.   IF you received labwork today, you will receive an invoice from [facility]. Please contact [facility] at [phone] with questions or concerns regarding your invoice.   Our billing staff will not be able to assist you with questions regarding bills from these companies.  You will be contacted with the lab results as soon as they are available. The fastest way to get your results is to activate your My Chart account. Instructions are located on the last page of this paperwork. If you have not heard from us regarding the results in 2 weeks, please contact this office.

## 2017-05-19 NOTE — Progress Notes (Signed)
05/26/2017 at 8:35 PM  Donna Harris / DOB: 1985/07/22 / MRN: [id number]  The patient has Severe obesity (BMI >= 40) (HCC) on their problem list.  SUBJECTIVE  Donna Harris is a 32 y.o. female who complains of right-sided pelvic pressure x ~1 year. Has been seen here and ED for this.  Started after having IUD placed.  It will occasionally radiate to the left side. Will have associated urge to urinate.  Has also had loose stools for months. Denies urinary frequency,  vaginal irritation, vaginal odor, discharge, dysuria, hematuria, fever, nausea, vomiting, hematochezia, melena, and mucopurulent stools. Denies recent travel. Nothing makes it better nothing makes it worse.  She is sexually active with monogamous partner.  LMP 04/25/17. Has past medical history of uterine fibroids and ovarian cyst.  Used to follow with gynecology but has not done so in a while. No PMH or FH of inflammatory or irritable  bowel disease or  kidney stones.   Last pelvic US in 05/2016 showed 2 small subserosal anterior uterine body fibroids. CT of abdomen/pelvis in 07/2016 showed fluid in distance transverse colon but no inflamed bowel, diminutive or absent appendix, and small gastic hiatal hernia, IUD in place.   She  has a past medical history of History of abnormal cervical Pap smear.    Medications reviewed and updated by myself where necessary, and exist elsewhere in the encounter.   Donna Harris is allergic to morphine and related. She  reports that she has been smoking cigarettes.  She has been smoking about 0.50 packs per day. She has never used smokeless tobacco. She reports that she drinks alcohol. She reports that she does not use drugs. She  reports that she currently engages in sexual activity and has had partners who are Female. She reports using the following method of birth control/protection: None. The patient  has a past surgical history that includes No past surgeries.  Her family history includes Cancer in her  mother and sister; Hyperlipidemia in her mother and sister; Hypertension in her mother and sister; Mental illness in her brother, mother, and sister.  Review of Systems  Cardiovascular: Negative for chest pain and palpitations.  Gastrointestinal: Negative for blood in stool and melena.  Genitourinary: Negative for flank pain.  Neurological: Negative for dizziness and headaches.    OBJECTIVE  Her  height is 5' 4.37" (1.635 m) and weight is 233 lb (105.7 kg). Her oral temperature is 99 F (37.2 C). Her blood pressure is 110/68 and her pulse is 104 (abnormal). Her respiration is 18 and oxygen saturation is 99%.  The patient's body mass index is 39.54 kg/m.  Physical Exam  Constitutional: She is oriented to person, place, and time. She appears well-developed and well-nourished.  HENT:  Head: Normocephalic and atraumatic.  Eyes: Conjunctivae are normal.  Neck: Normal range of motion.  Pulmonary/Chest: Effort normal.  Abdominal: Soft. Normal appearance. There is no rigidity, no rebound, no guarding, no CVA tenderness, no tenderness at McBurney's point and negative Murphy's sign.    Negative psoas and obturator sign  Neurological: She is alert and oriented to person, place, and time.  Skin: Skin is warm and dry.  Psychiatric: She has a normal mood and affect.  Vitals reviewed.   No results found for this or any previous visit (from the past 24 hour(s)). Temp Readings from Last 3 Encounters:  05/19/17 99 F (37.2 C) (Oral)  01/20/17 98.4 F (36.9 C) (Oral)  10/06/16 98 F (36.7 C) (  Oral)   Pulse Readings from Last 3 Encounters:  05/19/17 (!) 104  01/20/17 92  10/06/16 92   No results found.  ASSESSMENT & PLAN  Donna Harris was seen today for urinary frequency and pelvic pain.  Diagnoses and all orders for this visit:  Pelvic pain -     Hepatitis panel, acute -     GC/Chlamydia Probe Amp -     HIV antibody -     RPR -     POCT Wet + KOH Prep -     POCT CBC -     POCT  urine pregnancy -     DG Abd 1 View; Future -     US PELVIC COMPLETE WITH TRANSVAGINAL; Future -     GC/Chlamydia Probe Amp  Urinary frequency -     POCT urinalysis dipstick -     POCT glucose (manual entry) -     POCT Microscopic Urinalysis ([facility]) -     Urine Culture  Loose stools -     GI Profile, Stool, PCR -     C-reactive protein -     Sedimentation Rate  Unclear etiology at this time patient has been seen for this multiple times in the past by different providers over the past year.  Prior office and EDand imaging results notes over the past year  were personally reviewed by myself.  Patient has had long-standing leukocytosis, which could be due to infectious etiology or inflammatory etiology.  Labs today are nonspecific. KUB normal and shows IUD in place.  Further labs pending.  Patient is overall well-appearing, no distress.  She only has mild tenderness to palpation right lower pelvic region.  We will plan for repeat pelvic ultrasound.  Depending on lab results, we will discuss further treatment plan.The patient was advised to call or come back to clinic if she does not see an improvement in symptoms, or worsens with the above plan.   Benjiman CoreBrittany Yanelie Abraha, PA-C  [facility] 05/26/2017 8:35 PM

## 2017-05-20 LAB — HEPATITIS PANEL, ACUTE
HEP B C IGM: NEGATIVE
Hep A IgM: NEGATIVE
Hepatitis B Surface Ag: NEGATIVE

## 2017-05-20 LAB — HIV ANTIBODY (ROUTINE TESTING W REFLEX): HIV Screen 4th Generation wRfx: NONREACTIVE

## 2017-05-20 LAB — C-REACTIVE PROTEIN: CRP: 25.3 mg/L — ABNORMAL HIGH (ref 0.0–4.9)

## 2017-05-20 LAB — RPR: RPR: NONREACTIVE

## 2017-05-20 LAB — SEDIMENTATION RATE: Sed Rate: 114 mm/hr — ABNORMAL HIGH (ref 0–32)

## 2017-05-21 LAB — URINE CULTURE

## 2017-05-21 LAB — GC/CHLAMYDIA PROBE AMP
Chlamydia trachomatis, NAA: NEGATIVE
Neisseria gonorrhoeae by PCR: NEGATIVE

## 2017-05-26 ENCOUNTER — Telehealth: Payer: Self-pay | Admitting: Physician Assistant

## 2017-05-26 ENCOUNTER — Telehealth: Payer: Self-pay

## 2017-05-26 ENCOUNTER — Other Ambulatory Visit: Payer: Self-pay | Admitting: Physician Assistant

## 2017-05-26 DIAGNOSIS — R109 Unspecified abdominal pain: Secondary | ICD-10-CM

## 2017-05-26 DIAGNOSIS — R7982 Elevated C-reactive protein (CRP): Secondary | ICD-10-CM

## 2017-05-26 DIAGNOSIS — R700 Elevated erythrocyte sedimentation rate: Secondary | ICD-10-CM

## 2017-05-26 MED ORDER — CIPROFLOXACIN HCL 250 MG PO TABS: 250.0000 mg | ORAL_TABLET | Freq: Two times a day (BID) | ORAL | 0 refills | Status: DC

## 2017-05-26 NOTE — Telephone Encounter (Signed)
Copied from CRM [phone]. Topic: Quick Communication - See Telephone Encounter >> May 26, 2017  1:31 PM Terisa Starraylor, Brittany L wrote: CRM for notification. See Telephone encounter for: 05/26/17.  Patient said she saw her results on mychart from 4/18 & she said they were abnormal and she needs some medication. Please advise [phone] >> May 26, 2017  2:24 PM Floria RavelingStovall, Shana A wrote: Pt returned call.

## 2017-05-26 NOTE — Telephone Encounter (Signed)
Copied from CRM [phone]. Topic: Quick Communication - See Telephone Encounter >> May 26, 2017  1:31 PM Terisa Starraylor, Brittany L wrote: CRM for notification. See Telephone encounter for: 05/26/17.  Patient said she saw her results on mychart from 4/18 & she said they were abnormal and she needs some medication. Please advise [phone]

## 2017-05-26 NOTE — Telephone Encounter (Signed)
Copied from CRM #[id number]. Topic: Quick Communication - Rx Refill/Question >> May 26, 2017  3:49 PM Raquel SarnaHayes, Teresa G wrote: Pt wanting to know if some pain medication can be called in with her recent medication that was called in? Please call pt back to  let her know.

## 2017-05-26 NOTE — Progress Notes (Signed)
Orders Placed This Encounter  Procedures  . Ambulatory referral to Gastroenterology    Referral Priority:   Routine    Referral Type:   Consultation    Referral Reason:   Specialty Services Required    Number of Visits Requested:   1     Meds ordered this encounter  Medications  . ciprofloxacin (CIPRO) 250 MG tablet    Sig: Take 1 tablet (250 mg total) by mouth 2 (two) times daily.    Dispense:  6 tablet    Refill:  0    Order Specific Question:   Supervising Provider    Answer:   Ethelda ChickSMITH, KRISTI M [[id number]]

## 2017-05-26 NOTE — Telephone Encounter (Signed)
Call patient to discuss lab results.  No answer.  Left voicemail to contact our office back as I do not see a release of information that allows me to leave for a detailed message.

## 2017-05-26 NOTE — Telephone Encounter (Signed)
PEC message to [place]

## 2017-06-07 ENCOUNTER — Ambulatory Visit
Admission: RE | Admit: 2017-06-07 | Discharge: 2017-06-07 | Disposition: A | Discharge status: Home / Self Care | Payer: 59 | Source: Ambulatory Visit | Attending: Physician Assistant | Admitting: Physician Assistant

## 2017-06-07 DIAGNOSIS — R102 Pelvic and perineal pain: Secondary | ICD-10-CM

## 2017-06-13 ENCOUNTER — Telehealth: Payer: Self-pay | Admitting: Physician Assistant

## 2017-06-13 NOTE — Telephone Encounter (Signed)
Please advise 

## 2017-06-13 NOTE — Telephone Encounter (Unsigned)
Copied from CRM [id number]. Topic: Inquiry >> Jun 13, 2017  3:25 PM Raquel Sarna wrote: Pt needing imaging results asap. Please call pt back to discuss.

## 2017-06-14 NOTE — Telephone Encounter (Signed)
Results sent via mychart. Thanks!

## 2017-07-07 ENCOUNTER — Encounter: Payer: Self-pay | Admitting: Internal Medicine

## 2017-07-27 ENCOUNTER — Ambulatory Visit: Payer: 59 | Admitting: Internal Medicine

## 2017-09-12 ENCOUNTER — Ambulatory Visit: Payer: 59 | Admitting: Physician Assistant

## 2017-09-12 ENCOUNTER — Encounter: Payer: Self-pay | Admitting: Physician Assistant

## 2017-09-12 ENCOUNTER — Other Ambulatory Visit (INDEPENDENT_AMBULATORY_CARE_PROVIDER_SITE_OTHER): Payer: 59

## 2017-09-12 ENCOUNTER — Ambulatory Visit: Payer: 59 | Admitting: Internal Medicine

## 2017-09-12 VITALS — BP 118/70 | HR 96 | Ht 63.0 in | Wt 232.8 lb

## 2017-09-12 DIAGNOSIS — R1030 Lower abdominal pain, unspecified: Secondary | ICD-10-CM

## 2017-09-12 DIAGNOSIS — R194 Change in bowel habit: Secondary | ICD-10-CM

## 2017-09-12 LAB — HIGH SENSITIVITY CRP: CRP, High Sensitivity: 34.44 mg/L — ABNORMAL HIGH (ref 0.000–5.000)

## 2017-09-12 LAB — SEDIMENTATION RATE: Sed Rate: 77 mm/hr — ABNORMAL HIGH (ref 0–20)

## 2017-09-12 MED ORDER — NA SULFATE-K SULFATE-MG SULF 17.5-3.13-1.6 GM/177ML PO SOLN: 1.0000 | ORAL | 0 refills | Status: DC

## 2017-09-12 NOTE — Progress Notes (Addendum)
Chief Complaint: Abdominal Discomfort  HPI:   Donna Harris is a 32 year old female with past medical history as listed below, who was referred to our clinic by [place], PA-C for complaint of abdominal discomfort.    05/19/2017 office visit with PCP discussed right sided pelvic pressure x1 year which it started after having an IUD placed, had been seen in the ED for this.  That time she had multiple labs as well as a pelvic ultrasound and abdominal x-ray.  She also describes loose stools at that time and a GI profile panel as well as C-reactive protein and ESR rate were done.  Stool studies were never completed.  Eventually treated with ciprofloxacin for UTI due to dysuria and urinary frequency.  She had an elevated CRP of 25.3 and an ESR of 114  And was referred to our office.    Today, explains that about 3 to 4 months ago she had a lot of loose diarrheal stools which lasted for about 3 weeks, since then she has returned to normal solid stools but does have a constant suprapubic pain which sometimes radiates into her left lower quadrant rated is a constant 5/10and increases to a 10/10.  Somewhat worse when she stands up for long period of time.  Followed with gynecologist who diagnosed her with fibroids that were 5 cm in size but they still want her to follow with [place] in regards to an elevated ESR and CRP.  Associated symptoms include some nausea which comes on about a week before her cycle.  Patient did have IUD removed which seemed to cause some pain initially as well, but the same pain has continued.    Denies fever, chills, weight loss, blood in her stool, melena, family history of IBD, vomiting, heartburn or reflux.  Past Medical History:  Diagnosis Date  . History of abnormal cervical Pap smear     Past Surgical History:  Procedure Laterality Date  . NO PAST SURGERIES      Current Outpatient Medications  Medication Sig Dispense Refill  . albuterol (PROVENTIL HFA;VENTOLIN HFA) 108  (90 Base) MCG/ACT inhaler TAKE 2 PUFFS BY MOUTH EVERY 6 HOURS AS NEEDED FOR WHEEZE OR SHORTNESS OF BREATH (Patient not taking: Reported on 05/19/2017) 8.5 Inhaler 0  . ciprofloxacin (CIPRO) 250 MG tablet Take 1 tablet (250 mg total) by mouth 2 (two) times daily. 6 tablet 0  . diphenhydrAMINE (BENADRYL) 25 mg capsule Take 1 capsule (25 mg total) by mouth every 6 (six) hours as needed. (Patient not taking: Reported on 06/15/2016) 30 capsule 0  . esomeprazole (NEXIUM) 20 MG packet Take 20 mg by mouth daily before breakfast. (Patient not taking: Reported on 07/07/2016) 30 each 3  . HYDROcodone-acetaminophen (NORCO/VICODIN) 5-325 MG tablet Take 1 tablet by mouth every 4 (four) hours as needed. (Patient not taking: Reported on 05/19/2017) 10 tablet 0  . hydrocortisone (ANUSOL-HC) 2.5 % rectal cream Place 1 application rectally 2 (two) times daily. (Patient not taking: Reported on 05/19/2017) 30 g 0  . levonorgestrel (MIRENA) 20 MCG/24HR IUD 1 each by Intrauterine route once.    . metoCLOPramide (REGLAN) 10 MG tablet Take 1 tablet (10 mg total) by mouth every 6 (six) hours as needed for nausea or vomiting (nausea/headache). Take with 25 mg of Benadryl (Patient not taking: Reported on 06/15/2016) 20 tablet 0  . metroNIDAZOLE (FLAGYL) 500 MG tablet Take 1 tablet (500 mg total) by mouth 2 (two) times daily with a meal. DO NOT CONSUME ALCOHOL WHILE TAKING  THIS MEDICATION. (Patient not taking: Reported on 05/19/2017) 14 tablet 0  . naproxen (NAPROSYN) 500 MG tablet Take 1 tablet (500 mg total) by mouth 2 (two) times daily. (Patient not taking: Reported on 05/19/2017) 30 tablet 0  . ondansetron (ZOFRAN) 4 MG tablet Take 1 tablet (4 mg total) by mouth every 8 (eight) hours as needed for nausea or vomiting. (Patient not taking: Reported on 05/19/2017) 12 tablet 0  . ondansetron (ZOFRAN-ODT) 8 MG disintegrating tablet Take 1 tablet (8 mg total) by mouth every 8 (eight) hours as needed. for nausea (Patient not taking: Reported on  05/19/2017) 20 tablet 2  . potassium chloride 20 MEQ TBCR Take 20 mEq by mouth daily. 7 tablet 0  . promethazine (PHENERGAN) 25 MG tablet Take 1 tablet (25 mg total) by mouth every 6 (six) hours as needed for nausea or vomiting. (Patient not taking: Reported on 06/15/2016) 30 tablet 0   No current facility-administered medications for this visit.     Allergies as of 09/12/2017 - Review Complete 05/19/2017  Allergen Reaction Noted  . Morphine and related Other (See Comments) 09/03/2015    Family History  Problem Relation Age of Onset  . Cancer Mother   . Hyperlipidemia Mother   . Hypertension Mother   . Mental illness Mother   . Mental illness Sister   . Hypertension Sister   . Hyperlipidemia Sister   . Cancer Sister   . Mental illness Brother     Social History   Socioeconomic History  . Marital status: Single    Spouse name: n/a  . Number of children: 0  . Years of education: college  . Highest education level: Not on file  Occupational History  . Occupation: Leisure centre manager    Comment: [employer]  . Financial resource strain: Not on file  . Food insecurity:    Worry: Not on file    Inability: Not on file  . Transportation needs:    Medical: Not on file    Non-medical: Not on file  Tobacco Use  . Smoking status: Current Every Day Smoker    Packs/day: 0.50    Types: Cigarettes  . Smokeless tobacco: Never Used  Substance and Sexual Activity  . Alcohol use: Yes    Comment: once a month or less  . Drug use: No  . Sexual activity: Yes    Partners: Male    Birth control/protection: None    Comment: desires pregnancy  Lifestyle  . Physical activity:    Days per week: Not on file    Minutes per session: Not on file  . Stress: Not on file  Relationships  . Social connections:    Talks on phone: Not on file    Gets together: Not on file    Attends religious service: Not on file    Active member of club or organization: Not on file    Attends  meetings of clubs or organizations: Not on file    Relationship status: Not on file  . Intimate partner violence:    Fear of current or ex partner: Not on file    Emotionally abused: Not on file    Physically abused: Not on file    Forced sexual activity: Not on file  Other Topics Concern  . Not on file  Social History Narrative   College degree in Therapist, occupational from [employer].   Lives alone.   Family lives in [place].    Review of Systems:  Constitutional: No weight loss, fever or chills Skin: No rash  Cardiovascular: No chest pain Respiratory: No SOB  Gastrointestinal: See HPI and otherwise negative Genitourinary: No dysuria  Neurological: No headache, dizziness or syncope Musculoskeletal: No new muscle or joint pain Hematologic: No bleeding  Psychiatric: No history of depression or anxiety   Physical Exam:  Vital signs: BP 118/70   Pulse 96   Ht 5' 3"  (1.6 m)   Wt 232 lb 12.8 oz (105.6 kg)   BMI 41.24 kg/m   Constitutional:   Pleasant overweight AA female appears to be in NAD, Well developed, Well nourished, alert and cooperative Head:  Normocephalic and atraumatic. Eyes:   PEERL, EOMI. No icterus. Conjunctiva pink. Ears:  Normal auditory acuity. Neck:  Supple Throat: Oral cavity and pharynx without inflammation, swelling or lesion.  Respiratory: Respirations even and unlabored. Lungs clear to auscultation bilaterally.   No wheezes, crackles, or rhonchi.  Cardiovascular: Normal S1, S2. No MRG. Regular rate and rhythm. No peripheral edema, cyanosis or pallor.  Gastrointestinal:  Soft, nondistended, mild suprapubic and LLQ ttp, No rebound or guarding. Normal bowel sounds. No appreciable masses or hepatomegaly. Rectal:  Not performed.  Msk:  Symmetrical without gross deformities. Without edema, no deformity or joint abnormality.  Neurologic:  Alert and  oriented x4;  grossly normal neurologically.  Skin:   Dry and intact without significant lesions or  rashes. Psychiatric:  Demonstrates good judgement and reason without abnormal affect or behaviors.  MOST RECENT LABS AND IMAGING: CBC    Component Value Date/Time   WBC 12.3 (A) 05/19/2017 1234   WBC 16.8 (H) 07/07/2016 0945   RBC 4.61 05/19/2017 1234   RBC 4.86 07/07/2016 0945   HGB 12.3 05/19/2017 1234   HGB 13.6 07/07/2016 0945   HGB 11.8 06/15/2016 1105   HCT 38.6 05/19/2017 1234   HCT 39.5 07/07/2016 0945   HCT 36.2 06/15/2016 1105   PLT 417 (H) 07/07/2016 0945   PLT 332 06/15/2016 1105   MCV 83.7 05/19/2017 1234   MCV 84 06/15/2016 1105   MCH 26.7 (A) 05/19/2017 1234   MCH 28.0 07/07/2016 0945   MCHC 31.8 05/19/2017 1234   MCHC 34.4 07/07/2016 0945   RDW 14.8 07/07/2016 0945   RDW 15.2 06/15/2016 1105    CMP     Component Value Date/Time   NA 139 07/07/2016 0945   NA 138 06/15/2016 1105   K 2.8 (L) 07/07/2016 0945   CL 97 (L) 07/07/2016 0945   CO2 28 07/07/2016 0945   GLUCOSE 140 (H) 07/07/2016 0945   BUN 21 (H) 07/07/2016 0945   BUN 7 06/15/2016 1105   CREATININE 1.10 (H) 07/07/2016 0945   CALCIUM 10.0 07/07/2016 0945   PROT 9.2 (H) 07/07/2016 0945   ALBUMIN 4.7 07/07/2016 0945   AST 18 07/07/2016 0945   ALT 11 (L) 07/07/2016 0945   ALKPHOS 72 07/07/2016 0945   BILITOT 0.7 07/07/2016 0945   GFRNONAA >60 07/07/2016 0945   GFRAA >60 07/07/2016 0945    Assessment: 1.  Abdominal pain: Suprapubic and left lower quadrant discomfort, transvaginal ultrasound 06/07/2017 with fibroids and no other abnormalities, pain is somewhat worse when standing all day, elevated ESR and CRP as well as some loose stools which have since resolved, referred here by gynecologist; consider possible IBD versus IBS versus pelvic pain 2.  Change in bowel habits: Did have diarrhea for about 3 weeks which has since cleared, no hematochezia, no history of IBD; consider viral versus IBS vs  other  Plan: 1.  Patient would like to recheck her ESR and CRP.  If these are still elevated she is  okay with proceeding with colonoscopy for further evaluation.  If these are no longer elevated she believes she will pass on further work-up. 2.  Martin Majestic ahead and scheduled patient for a colonoscopy in the [facility] with Dr. Hilarie Fredrickson as he is supervising today.  Did discuss risk, benefits, limitations and alternatives and the patient agrees to proceed. 3.  Discussed a trial of antispasmodic with the patient which she declines today. 4.  Patient will follow in clinic per recommendations after labs/colonoscopy above.  Ellouise Newer, PA-C [facility] 09/12/2017, 2:48 PM  CC: Vesta Mixer, PA-C  Addendum: Reviewed and agree with initial management. Pyrtle, Lajuan Lines, MD

## 2017-09-12 NOTE — Patient Instructions (Addendum)
You may have a light breakfast the morning of prep day (the day before the procedure).   You may choose from one of the following items: eggs and toast OR chicken noodle soup and crackers.   You should have your breakfast completed between 8:00 and 9:00 am the day before your procedure.    After you have had your light breakfast you should start a clear liquid diet only, NO SOLIDS. No additional solid food is allowed. You may continue to have clear liquid up to 3 hours prior to your procedure.   You have been scheduled for a colonoscopy. Please follow written instructions given to you at your visit today.  Please pick up your prep supplies at the pharmacy within the next 1-3 days. If you use inhalers (even only as needed), please bring them with you on the day of your procedure. Your physician has requested that you go to www.startemmi.com and enter the access code given to you at your visit today. This web site gives a general overview about your procedure. However, you should still follow specific instructions given to you by our office regarding your preparation for the procedure.  Your provider has requested that you go to the basement level for lab work before leaving today. Press "B" on the elevator. The lab is located at the first door on the left as you exit the elevator.

## 2017-10-05 ENCOUNTER — Encounter: Payer: Self-pay | Admitting: Internal Medicine

## 2017-10-19 ENCOUNTER — Encounter: Payer: 59 | Admitting: Internal Medicine

## 2017-11-05 ENCOUNTER — Ambulatory Visit (HOSPITAL_COMMUNITY)
Admission: EM | Admit: 2017-11-05 | Discharge: 2017-11-05 | Disposition: A | Discharge status: Home / Self Care | Payer: 59 | Attending: Family Medicine | Admitting: Family Medicine

## 2017-11-05 ENCOUNTER — Encounter (HOSPITAL_COMMUNITY): Payer: Self-pay

## 2017-11-05 DIAGNOSIS — J019 Acute sinusitis, unspecified: Secondary | ICD-10-CM | POA: Diagnosis not present

## 2017-11-05 MED ORDER — FLUTICASONE PROPIONATE 50 MCG/ACT NA SUSP: 1.0000 | Freq: Every day | NASAL | 0 refills | Status: AC

## 2017-11-05 MED ORDER — DOXYCYCLINE HYCLATE 100 MG PO CAPS: 100.0000 mg | ORAL_CAPSULE | Freq: Two times a day (BID) | ORAL | 0 refills | Status: AC

## 2017-11-05 MED ORDER — CETIRIZINE HCL 10 MG PO CAPS: 10.0000 mg | ORAL_CAPSULE | Freq: Every day | ORAL | 0 refills | Status: AC

## 2017-11-05 MED ORDER — BENZONATATE 200 MG PO CAPS: 200.0000 mg | ORAL_CAPSULE | Freq: Three times a day (TID) | ORAL | 0 refills | Status: AC | PRN

## 2017-11-05 NOTE — Discharge Instructions (Addendum)
Please begin taking doxycycline twice daily for 10 days For congestion daily allergy pill, flonase nasal spray Tessalon for cough Tylenol and Ibuprofen for fever/body aches  Follow up if developing persistent cough, shortness of breath, difficulty breathing, fever

## 2017-11-05 NOTE — ED Provider Notes (Signed)
[name]    CSN: [id number] Arrival date & time: 11/05/17  1558     History   Chief Complaint Chief Complaint  Patient presents with  . Appointment  . (3:51)  Persistent Cough    HPI Donna Harris is a 32 y.o. female no concerning past medical history, Patient is presenting with URI symptoms- congestion, cough, sore throat. Patient's main complaints are cough.  Cough is productive.  Symptoms have been going on for 1 month. Patient has tried over-the-counter sinus and cold relief, cough drops, with minimal relief. Denies fever, nausea, vomiting, diarrhea. Denies shortness of breath and chest pain.  Patient is a current smoker.   HPI  Past Medical History:  Diagnosis Date  . History of abnormal cervical Pap smear     Patient Active Problem List   Diagnosis Date Noted  . Severe obesity (BMI >= 40) ([place]) 09/03/2015    Past Surgical History:  Procedure Laterality Date  . CERVICAL BIOPSY  W/ LOOP ELECTRODE EXCISION Bilateral   . NO PAST SURGERIES      OB History   None      Home Medications    Prior to Admission medications   Medication Sig Start Date End Date Taking? Authorizing Provider  benzonatate (TESSALON) 200 MG capsule Take 1 capsule (200 mg total) by mouth 3 (three) times daily as needed for up to 7 days for cough. 11/05/17 11/12/17  Wieters, Hallie C, PA-C  Cetirizine HCl 10 MG CAPS Take 1 capsule (10 mg total) by mouth daily for 10 days. 11/05/17 11/15/17  Wieters, Hallie C, PA-C  doxycycline (VIBRAMYCIN) 100 MG capsule Take 1 capsule (100 mg total) by mouth 2 (two) times daily for 10 days. 11/05/17 11/15/17  Wieters, Hallie C, PA-C  fluticasone (FLONASE) 50 MCG/ACT nasal spray Place 1-2 sprays into both nostrils daily for 7 days. 11/05/17 11/12/17  Wieters, Hallie C, PA-C  levonorgestrel (MIRENA) 20 MCG/24HR IUD 1 each by Intrauterine route once.    [provider]  Na Sulfate-K Sulfate-Mg Sulf (SUPREP BOWEL PREP KIT) 17.5-3.13-1.6  GM/177ML SOLN Take 1 kit by mouth as directed. 09/12/17   Levin Erp, PA  potassium chloride 20 MEQ TBCR Take 20 mEq by mouth daily. 07/07/16 07/14/16  Tegeler, Gwenyth Allegra, MD    Family History Family History  Problem Relation Age of Onset  . Cancer Mother   . Hyperlipidemia Mother   . Hypertension Mother   . Mental illness Mother   . Mental illness Sister   . Hypertension Sister   . Hyperlipidemia Sister   . Cancer Sister   . Mental illness Brother     Social History Social History   Tobacco Use  . Smoking status: Current Every Day Smoker    Packs/day: 0.50    Types: Cigarettes  . Smokeless tobacco: Never Used  Substance Use Topics  . Alcohol use: Yes    Comment: once a month or less  . Drug use: No     Allergies   Morphine and related   Review of Systems Review of Systems  Constitutional: Negative for activity change, appetite change, chills, fatigue and fever.  HENT: Positive for congestion, rhinorrhea, sinus pressure and sore throat. Negative for ear pain and trouble swallowing.   Eyes: Negative for discharge and redness.  Respiratory: Positive for cough. Negative for chest tightness and shortness of breath.   Cardiovascular: Negative for chest pain.  Gastrointestinal: Negative for abdominal pain, diarrhea, nausea and vomiting.  Musculoskeletal: Negative for  myalgias.  Skin: Negative for rash.  Neurological: Negative for dizziness, light-headedness and headaches.     Physical Exam Triage Vital Signs ED Triage Vitals  Enc Vitals Group     BP 11/05/17 1613 110/75     Pulse Rate 11/05/17 1613 84     Resp 11/05/17 1613 20     Temp 11/05/17 1613 98.2 F (36.8 C)     Temp Source 11/05/17 1613 Oral     SpO2 11/05/17 1613 100 %     Weight --      Height --      Head Circumference --      Peak Flow --      Pain Score 11/05/17 1614 6     Pain Loc --      Pain Edu? --      Excl. in [place]? --    No data found.  Updated Vital Signs BP 110/75  (BP Location: Right Arm)   Pulse 84   Temp 98.2 F (36.8 C) (Oral)   Resp 20   SpO2 100%   Visual Acuity Right Eye Distance:   Left Eye Distance:   Bilateral Distance:    Right Eye Near:   Left Eye Near:    Bilateral Near:     Physical Exam  Constitutional: She appears well-developed and well-nourished. No distress.  HENT:  Head: Normocephalic and atraumatic.  Bilateral ears without tenderness to palpation of external auricle, tragus and mastoid, EAC's without erythema or swelling, TM's with good bony landmarks and cone of light. Non erythematous.  Nasal mucosa erythematous, swollen turbinates, clear rhinorrhea present bilateral nares  Oral mucosa pink and moist, no tonsillar enlargement or exudate. Posterior pharynx patent and nonerythematous, no uvula deviation or swelling. Normal phonation.   Eyes: Conjunctivae are normal.  Neck: Neck supple.  Cardiovascular: Normal rate and regular rhythm.  No murmur heard. Pulmonary/Chest: Effort normal and breath sounds normal. No respiratory distress.  Breathing comfortably at rest, CTABL, no wheezing, rales or other adventitious sounds auscultated  Abdominal: Soft. There is no tenderness.  Musculoskeletal: She exhibits no edema.  Neurological: She is alert.  Skin: Skin is warm and dry.  Psychiatric: She has a normal mood and affect.  Nursing note and vitals reviewed.    UC Treatments / Results  Labs (all labs ordered are listed, but only abnormal results are displayed) Labs Reviewed - No data to display  EKG None  Radiology No results found.  Procedures Procedures (including critical care time)  Medications Ordered in UC Medications - No data to display  Initial Impression / Assessment and Plan / UC Course  I have reviewed the triage vital signs and the nursing notes.  Pertinent labs & imaging results that were available during my care of the patient were reviewed by me and considered in my medical decision making  (see chart for details).     Patient with 1 month of URI symptoms, exam unremarkable, vital signs stable, will treat for sinusitis versus atypical respiratory illness with doxycycline.  Discussed further symptomatic management with daily allergy pill, Flonase.  Tessalon for cough.Discussed strict return precautions. Patient verbalized understanding and is agreeable with plan.  Final Clinical Impressions(s) / UC Diagnoses   Final diagnoses:  Acute sinusitis with symptoms > 10 days     Discharge Instructions     Please begin taking doxycycline twice daily for 10 days For congestion daily allergy pill, flonase nasal spray Tessalon for cough Tylenol and Ibuprofen for fever/body aches  Follow  up if developing persistent cough, shortness of breath, difficulty breathing, fever   ED Prescriptions    Medication Sig Dispense Auth. Provider   doxycycline (VIBRAMYCIN) 100 MG capsule Take 1 capsule (100 mg total) by mouth 2 (two) times daily for 10 days. 20 capsule Wieters, Hallie C, PA-C   Cetirizine HCl 10 MG CAPS Take 1 capsule (10 mg total) by mouth daily for 10 days. 10 capsule Wieters, Hallie C, PA-C   fluticasone (FLONASE) 50 MCG/ACT nasal spray Place 1-2 sprays into both nostrils daily for 7 days. 1 g Wieters, Hallie C, PA-C   benzonatate (TESSALON) 200 MG capsule Take 1 capsule (200 mg total) by mouth 3 (three) times daily as needed for up to 7 days for cough. 28 capsule Wieters, Hallie C, PA-C     Controlled Substance Prescriptions [place] Controlled Substance Registry consulted? Not Applicable   Janith Lima, [place] 11/05/17 1751

## 2017-11-05 NOTE — ED Notes (Signed)
Bed: UC01 Expected date:  Expected time:  Means of arrival:  Comments: Hold for Appointments 

## 2017-11-05 NOTE — ED Triage Notes (Signed)
Pt presents with ongoing persistent cough that has been unrelieved with OTC medication.

## 2017-11-24 ENCOUNTER — Ambulatory Visit (INDEPENDENT_AMBULATORY_CARE_PROVIDER_SITE_OTHER): Payer: 59 | Admitting: Physician Assistant

## 2017-11-24 ENCOUNTER — Encounter: Payer: Self-pay | Admitting: Physician Assistant

## 2017-11-24 ENCOUNTER — Telehealth: Payer: Self-pay | Admitting: Physician Assistant

## 2017-11-24 VITALS — BP 119/80 | HR 107 | Temp 98.0°F | Resp 18 | Ht 63.0 in | Wt 231.0 lb

## 2017-11-24 DIAGNOSIS — Z113 Encounter for screening for infections with a predominantly sexual mode of transmission: Secondary | ICD-10-CM

## 2017-11-24 DIAGNOSIS — B9689 Other specified bacterial agents as the cause of diseases classified elsewhere: Secondary | ICD-10-CM

## 2017-11-24 DIAGNOSIS — E119 Type 2 diabetes mellitus without complications: Secondary | ICD-10-CM | POA: Insufficient documentation

## 2017-11-24 DIAGNOSIS — N898 Other specified noninflammatory disorders of vagina: Secondary | ICD-10-CM

## 2017-11-24 DIAGNOSIS — N760 Acute vaginitis: Secondary | ICD-10-CM | POA: Diagnosis not present

## 2017-11-24 DIAGNOSIS — B3731 Acute candidiasis of vulva and vagina: Secondary | ICD-10-CM

## 2017-11-24 DIAGNOSIS — B373 Candidiasis of vulva and vagina: Secondary | ICD-10-CM | POA: Diagnosis not present

## 2017-11-24 LAB — POCT WET + KOH PREP
Trich by wet prep: ABSENT
Yeast by KOH: ABSENT
Yeast by wet prep: ABSENT

## 2017-11-24 LAB — POCT GLYCOSYLATED HEMOGLOBIN (HGB A1C): Hemoglobin A1C: 6.6 % — AB (ref 4.0–5.6)

## 2017-11-24 MED ORDER — CLINDAMYCIN HCL 300 MG PO CAPS: 300.0000 mg | ORAL_CAPSULE | Freq: Two times a day (BID) | ORAL | 0 refills | Status: AC

## 2017-11-24 MED ORDER — METRONIDAZOLE 500 MG PO TABS: 500.0000 mg | ORAL_TABLET | Freq: Two times a day (BID) | ORAL | 0 refills | Status: AC

## 2017-11-24 MED ORDER — FLUCONAZOLE 150 MG PO TABS: 150.0000 mg | ORAL_TABLET | Freq: Once | ORAL | 0 refills | Status: AC

## 2017-11-24 NOTE — Telephone Encounter (Signed)
-----   Message from [facility], PA-C sent at 11/24/2017  2:15 PM EDT ----- Regarding: diabetes Please call pt and let her know her hemoglobin A1C indicated diabetes. We will discuss treatment at her annual visit in a few weeks. Be sure to make healthy food choices, cutting back on sugars, soda, juice, carbs, starchy foods.   Schedule - please schedule her for annual exam in the next 2-4 weeks. Thank you!

## 2017-11-24 NOTE — Progress Notes (Signed)
Donna Harris  MRN: [id number] DOB: Apr 24, 1985  PCP: Patient, No Pcp Per  Subjective:  Pt is a 32 year old female who presents to clinic for 1-2 days of white vaginal discharge. she is here today with her boyfriend.  Endorses odor.  She has had yeast infections and BV in the past.  She has not taken anything to feel better.  Denies urinary symptoms, pelvic pain, pain with intercourse,fever, chills.   "I pee all the time" Urinates about 1x/hour. She wakes up 3x/night to urinate.  Feels thirsty a lot.  Has been diagnosed with prediabetes in the past. Has not improved diet or started exercising.   Lab Results  Component Value Date   HGBA1C 5.7 (H) 06/15/2016    Review of Systems  Constitutional: Negative for chills and fever.  Genitourinary: Positive for vaginal discharge. Negative for urgency and vaginal pain.    Patient Active Problem List   Diagnosis Date Noted  . Severe obesity (BMI >= 40) (HCC) 09/03/2015    Current Outpatient Medications on File Prior to Visit  Medication Sig Dispense Refill  . levonorgestrel (MIRENA) 20 MCG/24HR IUD 1 each by Intrauterine route once.    . Cetirizine HCl 10 MG CAPS Take 1 capsule (10 mg total) by mouth daily for 10 days. 10 capsule 0  . fluticasone (FLONASE) 50 MCG/ACT nasal spray Place 1-2 sprays into both nostrils daily for 7 days. 1 g 0  . potassium chloride 20 MEQ TBCR Take 20 mEq by mouth daily. 7 tablet 0   No current facility-administered medications on file prior to visit.     Allergies  Allergen Reactions  . Morphine And Related Other (See Comments)    Stopped breathing     Objective:  BP 119/80   Pulse (!) 107   Temp 98 F (36.7 C) (Oral)   Resp 18   Ht 5\' 3"  (1.6 m)   Wt 231 lb (104.8 kg)   SpO2 100%   BMI 40.92 kg/m   Physical Exam  Constitutional: She appears well-developed and well-nourished.  Abdominal: Soft. There is no tenderness.  Genitourinary:  Genitourinary Comments: Pt self collect  Skin: Skin  is warm and dry.  Psychiatric: She has a normal mood and affect. Thought content normal.  Vitals reviewed.  Results for orders placed or performed in visit on 11/24/17  POCT Wet + KOH Prep  Result Value Ref Range   Yeast by KOH Absent Absent   Yeast by wet prep Absent Absent   WBC by wet prep Moderate (A) Few   Clue Cells Wet Prep HPF POC Few (A) None   Trich by wet prep Absent Absent   Bacteria Wet Prep HPF POC Moderate (A) Few   Epithelial Cells By [employer] ([facility]) Moderate (A) None, Few, Too numerous to count   RBC,UR,HPF,POC None None RBC/hpf  POCT glycosylated hemoglobin (Hb A1C)  Result Value Ref Range   Hemoglobin A1C 6.6 (A) 4.0 - 5.6 %   HbA1c POC (<> result, manual entry)     HbA1c, POC (prediabetic range)     HbA1c, POC (controlled diabetic range)      Assessment and Plan :  1. BV (bacterial vaginosis) - Pt presents c/o vaginal discharge and odor. She does have h/o yeast infections. A1C done today and indicates DM - new dx. Will treat at her annual exam. Wet prep shows BV - plan to treat and diflucan after for yeast. STD screening done today - she is asymptomatic.  -  metroNIDAZOLE (FLAGYL) 500 MG tablet; Take 1 tablet (500 mg total) by mouth 2 (two) times daily with a meal. DO NOT CONSUME ALCOHOL WHILE TAKING THIS MEDICATION.  Dispense: 14 tablet; Refill: 0 - clindamycin (CLEOCIN) 300 MG capsule; Take 1 capsule (300 mg total) by mouth 2 (two) times daily for 7 days.  Dispense: 14 capsule; Refill: 0  2. Yeast vaginitis - POCT glycosylated hemoglobin (Hb A1C) - fluconazole (DIFLUCAN) 150 MG tablet; Take 1 tablet (150 mg total) by mouth once for 1 dose. Repeat if needed  Dispense: 2 tablet; Refill: 0  3. Vaginal discharge - GC/Chlamydia Probe Amp - POCT Wet + KOH Prep  4. Screen for STD (sexually transmitted disease) - HIV Antibody (routine testing w rflx) - RPR  5. Type 2 diabetes mellitus without complication, without long-term current use of insulin (HCC) - A1C is  6.6%. Plan to discuss DM treatment at her annual exam in a few weeks. PAP needed. Advised DM friendly diet and exercise.    Marco Collie, PA-C  Primary [facility] at [facility] 11/24/2017 12:38 PM  Please note: Portions of this report may have been transcribed using dragon voice recognition software. Every effort was made to ensure accuracy; however, inadvertent computerized transcription errors may be present.

## 2017-11-24 NOTE — Telephone Encounter (Signed)
Called and spoke with pt regarding making a CPE appt. I made the appt for her 12/22/17. I advised of fasting, time, building and late policy.  Pt may have to call back and reschedule due to work.

## 2017-11-24 NOTE — Patient Instructions (Addendum)
Come back and see [place] for an annual exam in the next 1-2 months.   For BV - take clindamycin 300 mg twice daily for 7 days  After clindamycin, you can take one dose of diflucan (for yeast)   Keeping You Healthy   Get These Tests  Blood Pressure- Have your blood pressure checked by your healthcare provider at least once a year.  Normal blood pressure is 120/80.  Weight- Have your body mass index (BMI) calculated to screen for obesity.  BMI is a measure of body fat based on height and weight.  You can calculate your own BMI at https://www.west-esparza.com/  Cholesterol- Have your cholesterol checked every year.  Diabetes- Have your blood sugar checked every year if you have high blood pressure, high cholesterol, a family history of diabetes or if you are overweight.  Pap Test - Have a pap test every 1 to 5 years if you have been sexually active.  If you are older than 65 and recent pap tests have been normal you may not need additional pap tests.  In addition, if you have had a hysterectomy  for benign disease additional pap tests are not necessary.  Mammogram-Yearly mammograms are essential for early detection of breast cancer  Screening for Colon Cancer- Colonoscopy starting at age 73. Screening may begin sooner depending on your family history and other health conditions.  Follow up colonoscopy as directed by your Gastroenterologist.  Screening for Osteoporosis- Screening begins at age 22 with bone density scanning, sooner if you are at higher risk for developing Osteoporosis.   Get these medicines  Calcium with Vitamin D- Your body requires 1200-1500 mg of Calcium a day and [phone] IU of Vitamin D a day.  You can only absorb 500 mg of Calcium at a time therefore Calcium must be taken in 2 or 3 separate doses throughout the day.  Hormones- Hormone therapy has been associated with increased risk for certain cancers and heart disease.  Talk to your healthcare provider about if you need  relief from menopausal symptoms.  Aspirin- Ask your healthcare provider about taking Aspirin to prevent Heart Disease and Stroke.   Get these Immuniztions  Flu shot- Every fall  Pneumonia shot- Once after the age of 67; if you are younger ask your healthcare provider if you need a pneumonia shot.  Tetanus- Every ten years.  Zostavax- Once after the age of 53 to prevent shingles.   Take these steps  Don't smoke- Your healthcare provider can help you quit. For tips on how to quit, ask your healthcare provider or go to www.smokefree.gov or call 1-800 QUIT-NOW.  Be physically active- Exercise 5 days a week for a minimum of 30 minutes.  If you are not already physically active, start slow and gradually work up to 30 minutes of moderate physical activity.  Try walking, dancing, bike riding, swimming, etc.  Eat a healthy diet- Eat a variety of healthy foods such as fruits, vegetables, whole grains, low fat milk, low fat cheeses, yogurt, lean meats, chicken, fish, eggs, dried beans, tofu, etc.  For more information go to www.thenutritionsource.org  Dental visit- Brush and floss teeth twice daily; visit your dentist twice a year.  Eye exam- Visit your Optometrist or Ophthalmologist yearly.  Drink alcohol in moderation- Limit alcohol intake to one drink or less a day.  Never drink and drive.  Depression- Your emotional health is as important as your physical health.  If you're feeling down or losing interest in things you  normally enjoy, please talk to your healthcare provider.  Seat Belts- can save your life; always wear one  Smoke/Carbon Monoxide detectors- These detectors need to be installed on the appropriate level of your home.  Replace batteries at least once a year.  Violence- If anyone is threatening or hurting you, please tell your healthcare provider.  Living Will/ Health care power of attorney- Discuss with your healthcare provider and family.   IF you received an x-ray  today, you will receive an invoice from [facility]. Please contact [facility] at [phone] with questions or concerns regarding your invoice.   IF you received labwork today, you will receive an invoice from [facility]. Please contact [facility] at [phone] with questions or concerns regarding your invoice.   Our billing staff will not be able to assist you with questions regarding bills from these companies.  You will be contacted with the lab results as soon as they are available. The fastest way to get your results is to activate your My Chart account. Instructions are located on the last page of this paperwork. If you have not heard from [place] regarding the results in 2 weeks, please contact this office.

## 2017-11-25 LAB — HIV ANTIBODY (ROUTINE TESTING W REFLEX): HIV Screen 4th Generation wRfx: NONREACTIVE

## 2017-11-25 LAB — GC/CHLAMYDIA PROBE AMP
Chlamydia trachomatis, NAA: NEGATIVE
Neisseria gonorrhoeae by PCR: NEGATIVE

## 2017-11-25 LAB — RPR: RPR Ser Ql: NONREACTIVE

## 2017-11-30 ENCOUNTER — Telehealth: Payer: Self-pay | Admitting: Physician Assistant

## 2017-11-30 NOTE — Telephone Encounter (Signed)
Copied from CRM [id number]. Topic: Quick Communication - See Telephone Encounter >> Nov 30, 2017 12:23 PM Arlyss Gandy, NT wrote: CRM for notification. See Telephone encounter for: 11/30/17. Pt would like a call to discuss her lab results.

## 2017-11-30 NOTE — Telephone Encounter (Signed)
Called and spoke with pt regarding rescheduling their appt with [facility] on 12/22/17 due to the providers schedule change.   I was able to reschedule to 12/15/17 at 11:40 with [facility] for the CPE. I advised of fasting, time, building and late policy.

## 2017-12-15 ENCOUNTER — Encounter: Payer: 59 | Admitting: Physician Assistant

## 2017-12-22 ENCOUNTER — Encounter: Payer: 59 | Admitting: Physician Assistant

## 2017-12-24 IMAGING — CT CT ABD-PELV W/ CM
2 of 5 series · 16 of 46 positions shown, 18 images · IV contrast (iopamidol)
Comparison: Pelvis ultrasound 05/26/2016.

CLINICAL DATA: 22-year-old female with right lower quadrant and
lower abdominal pain for 3 days with nausea and vomiting. IUD
placement 07/01/2016 with some vaginal bleeding.

EXAM:
CT ABDOMEN AND PELVIS WITH CONTRAST
TECHNIQUE: Multidetector CT imaging of the abdomen and pelvis was performed
using the standard protocol following bolus administration of
intravenous contrast.
CONTRAST:  100mL [id number] IOPAMIDOL ([id number]) INJECTION 61%

[Series 4: abd/pel with · axial · 0.74mm/px · z∈[+1368,+1738]mm · 13 of 84 slices shown, 15 images]
[im 5/84  soft-tissue]
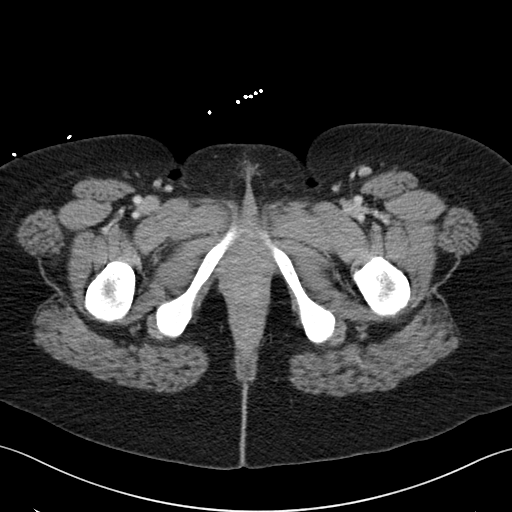
[im 5/84  bone]
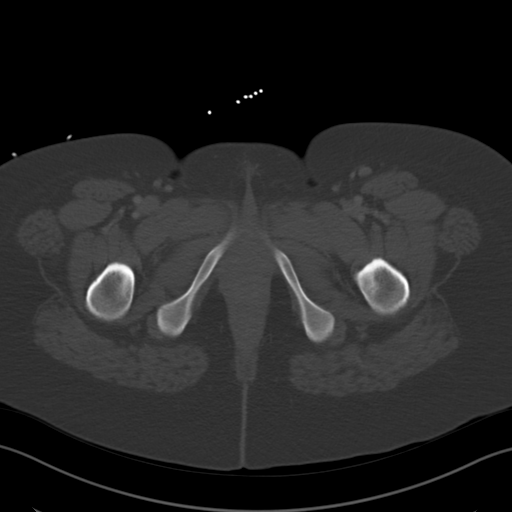
[im 13/84  soft-tissue]
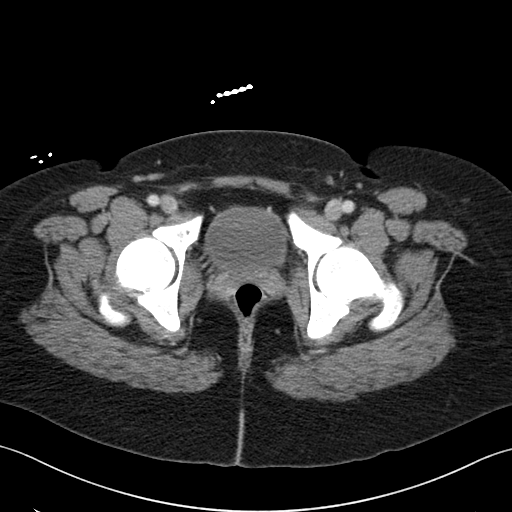
[im 17/84  soft-tissue]
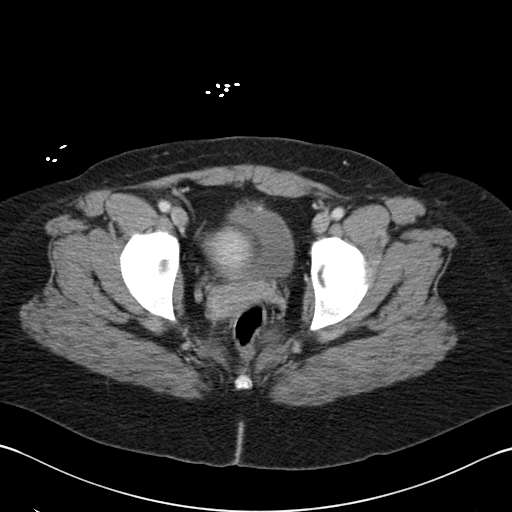
[im 25/84  soft-tissue]
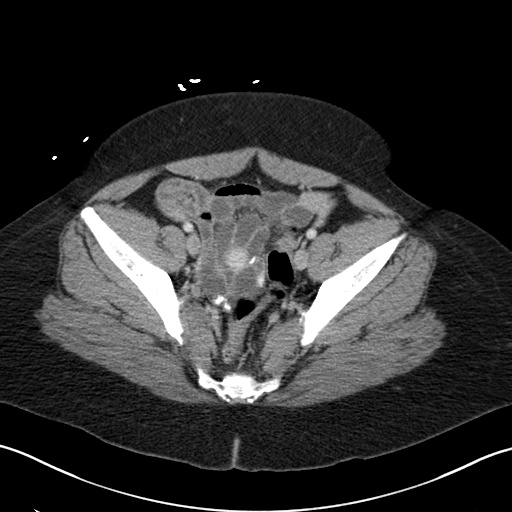
[im 30/84  soft-tissue]
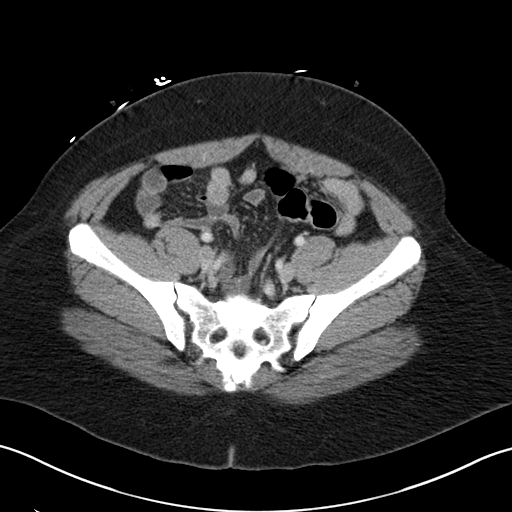
[im 38/84  soft-tissue]
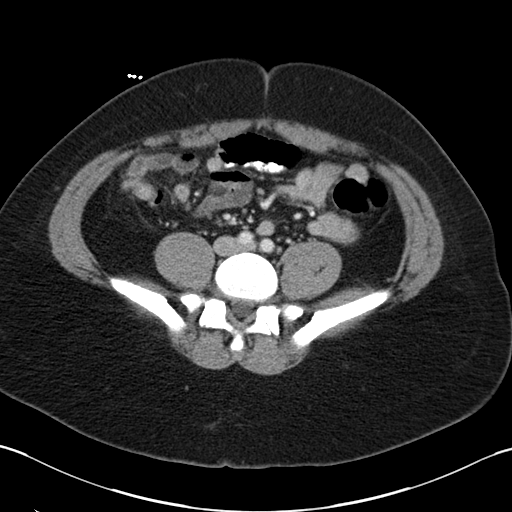
[im 42/84  soft-tissue]
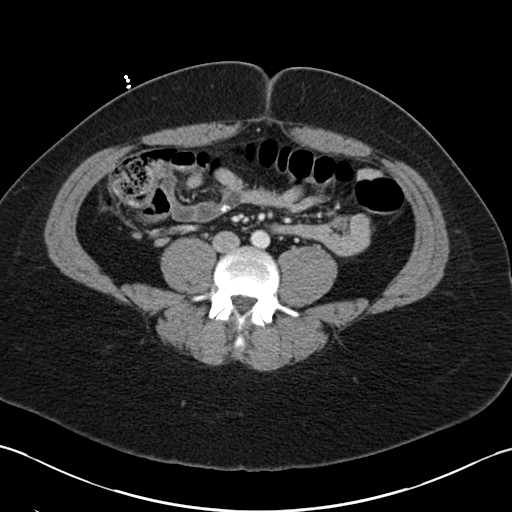
[im 46/84  soft-tissue]
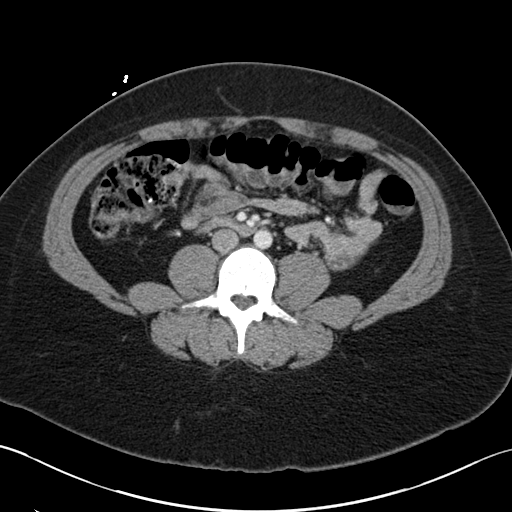
[im 54/84  soft-tissue]
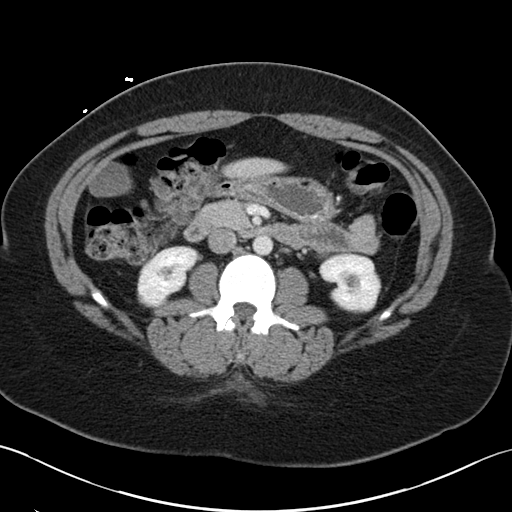
[im 54/84  bone]
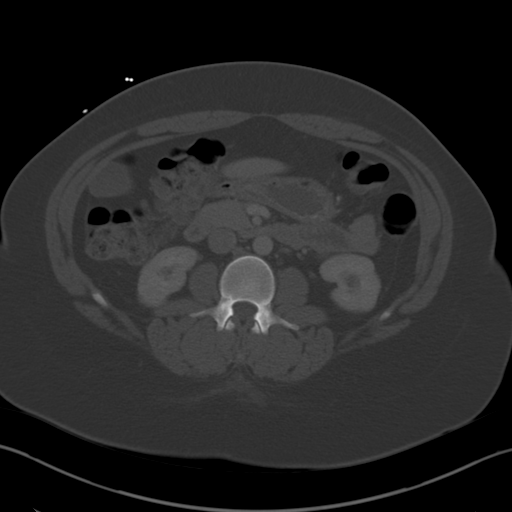
[im 59/84  soft-tissue]
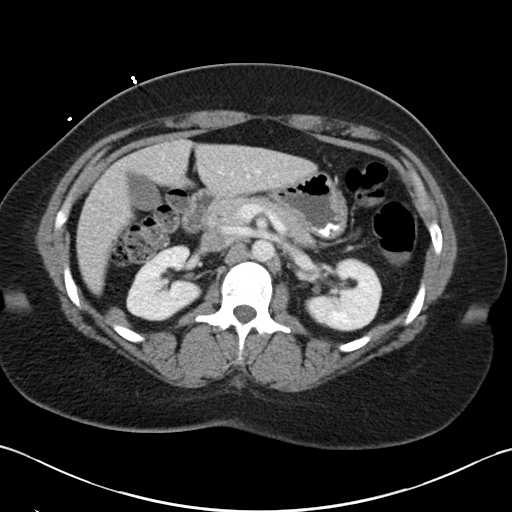
[im 67/84  soft-tissue]
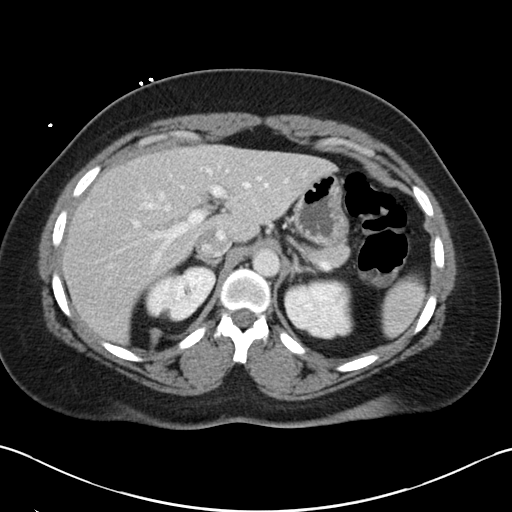
[im 71/84  soft-tissue]
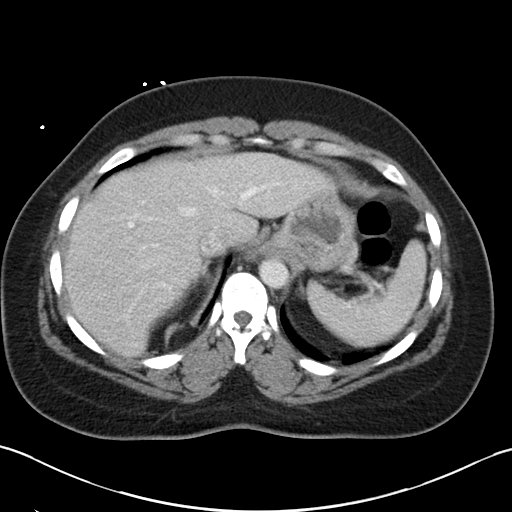
[im 79/84  soft-tissue]
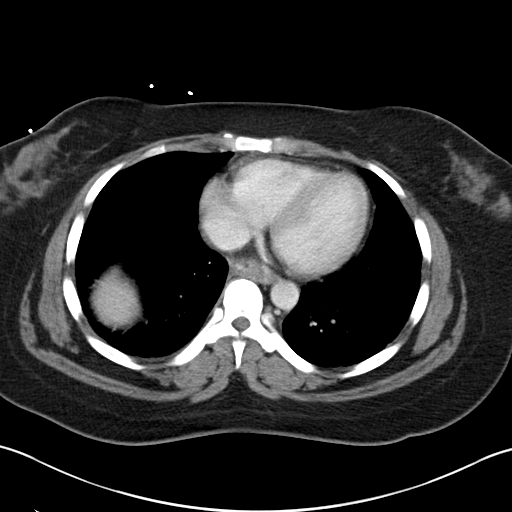

[Series 7: coronal a/|p · coronal · 0.83mm/px · 3 of 190 slices shown]
[im 64/190  soft-tissue]
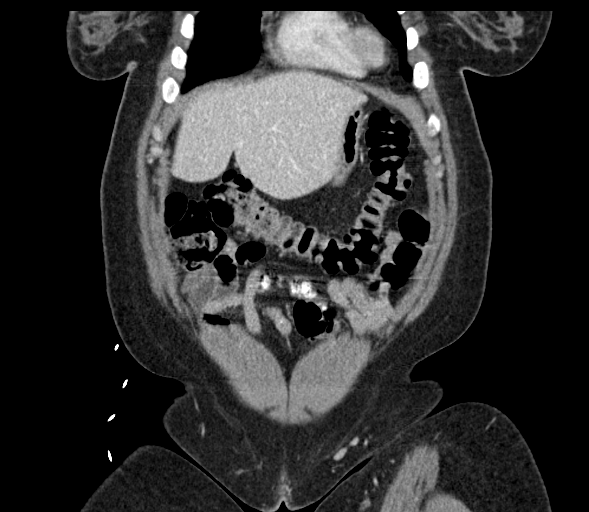
[im 85/190  soft-tissue]
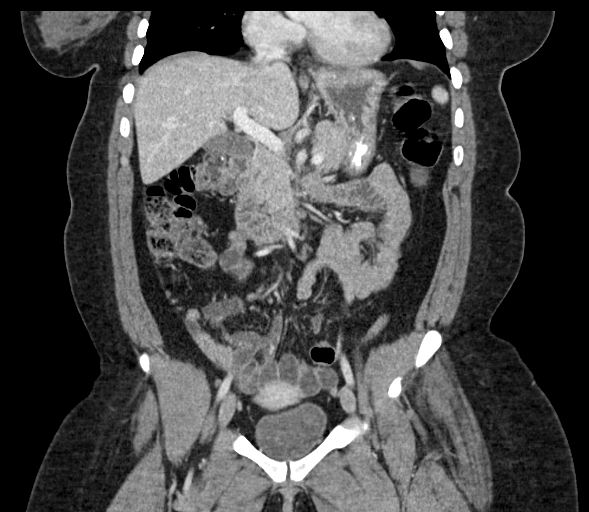
[im 106/190  soft-tissue]
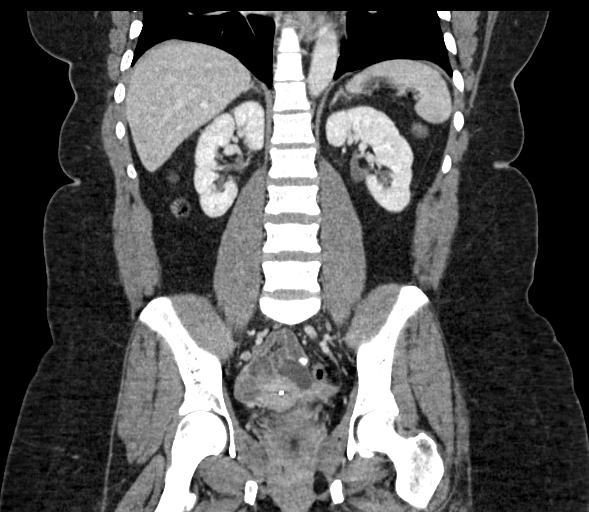

[16 of 46 positions shown; findings below may reference images not displayed]

FINDINGS: Lower chest: Normal lung bases with minor atelectasis. No
pericardial or pleural effusion.

Hepatobiliary: Negative liver and gallbladder.

Pancreas: Negative.

Spleen: Negative.

Adrenals/Urinary Tract: Normal adrenal glands.

Mild renal lobulation but otherwise normal kidneys. No
hydronephrosis or perinephric stranding. No hydroureter or
periureteral stranding. Unremarkable urinary bladder. Several pelvic
phleboliths.

Stomach/Bowel: Negative rectum and sigmoid colon. Gas and fluid in
the left colon and at the splenic flexure. Similar gas and fluid in
the distal transverse colon. Retained stool at the hepatic flexure
and in the right colon. No dilated large bowel. Diminutive or absent
appendix. No pericecal inflammation.

Multiple flocculation hyperdense foci in the distal small bowel,
might reflect vitamin supplements or similar enteric contents.
Otherwise negative terminal ileum. Fluid-filled but nondilated small
bowel loops in the pelvis. No dilated or abnormal small bowel loops
in the abdomen. Small gastric hiatal hernia. Otherwise negative
stomach and duodenum.

No abdominal free fluid.

Vascular/Lymphatic: Major arterial structures in the abdomen and
pelvis appear patent and normal. Portal venous system is patent. No
lymphadenopathy.

Reproductive: Negative; IUD in place (series 4, image 65).

Other: No pelvic free fluid.

Musculoskeletal: No osseous abnormality identified.
IMPRESSION: 1. Fluid in the distal transverse and left colon such as can be seen
with diarrhea, but no inflamed bowel. Appendix is diminutive or
absent.
2. Small gastric hiatal hernia.  IUD in place.

## 2018-11-15 IMAGING — US US PELVIS COMPLETE TRANSABD/TRANSVAG
1 series · 13 of 25 positions shown · non-contrast
Comparison: None

CLINICAL DATA: Right pelvic pain.

EXAM:
TRANSABDOMINAL AND TRANSVAGINAL ULTRASOUND OF PELVIS
TECHNIQUE: Both transabdominal and transvaginal ultrasound examinations of the
pelvis were performed. Transabdominal technique was performed for
global imaging of the pelvis including uterus, ovaries, adnexal
regions, and pelvic cul-de-sac. It was necessary to proceed with
endovaginal exam following the transabdominal exam to visualize the
endometrium and ovaries.

[Series 1: us pelvis complete transabd/transvag · 0.25mm/px · 57 acquisitions, 13 frames shown]
[im 1/57]
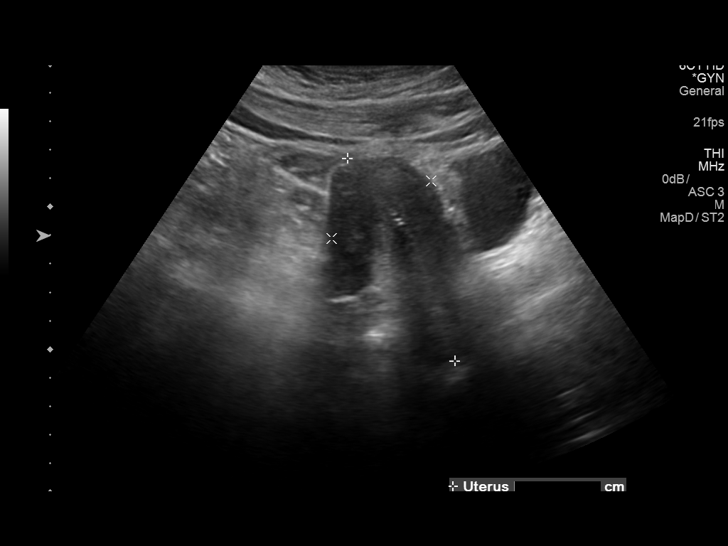
[im 5/57]
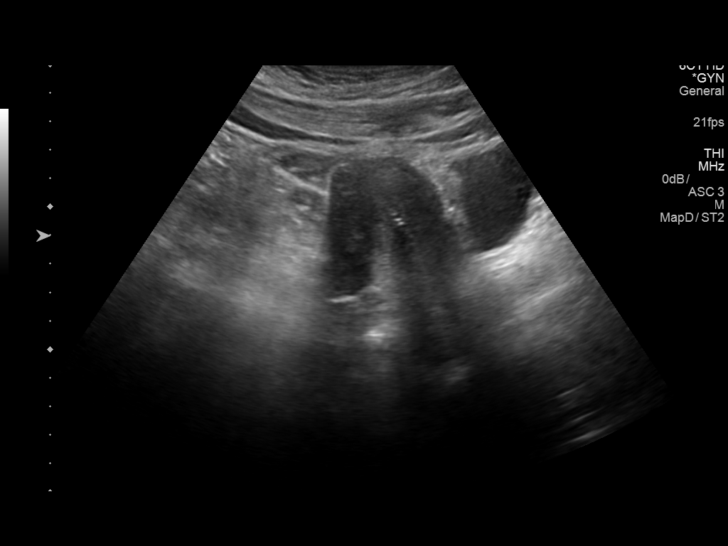
[im 10/57]
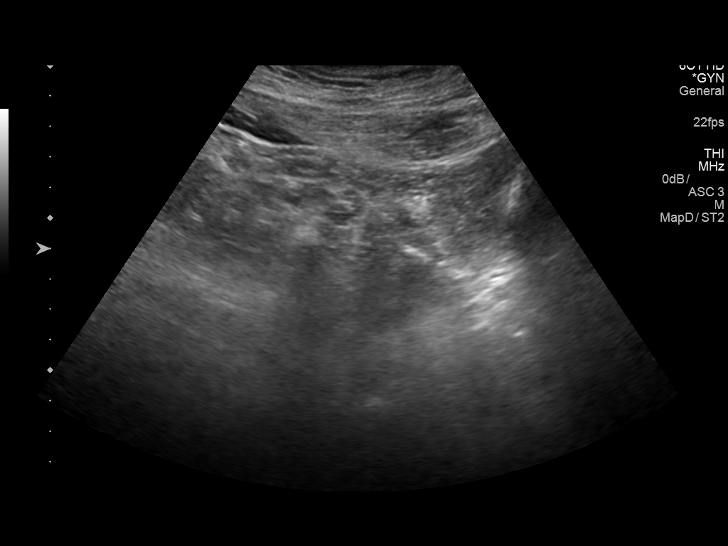
[im 15/57]
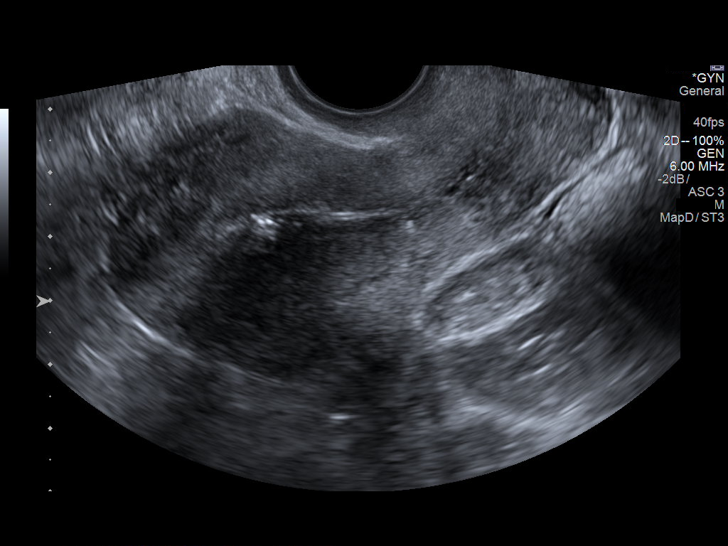
[im 19/57]
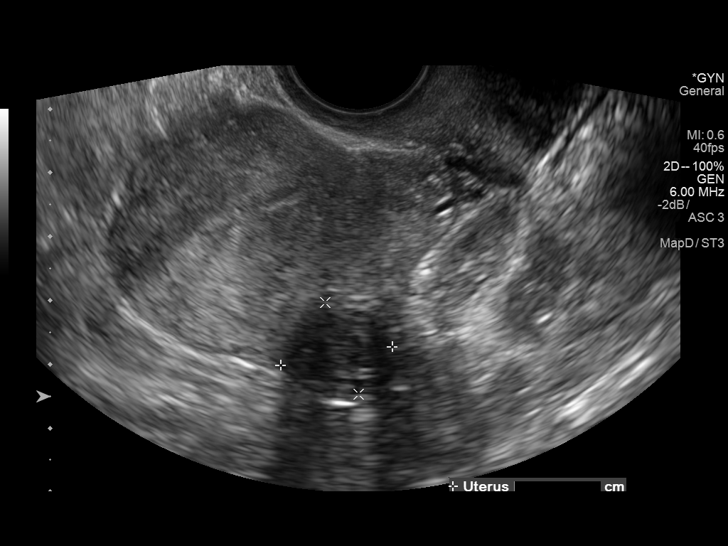
[im 24/57]
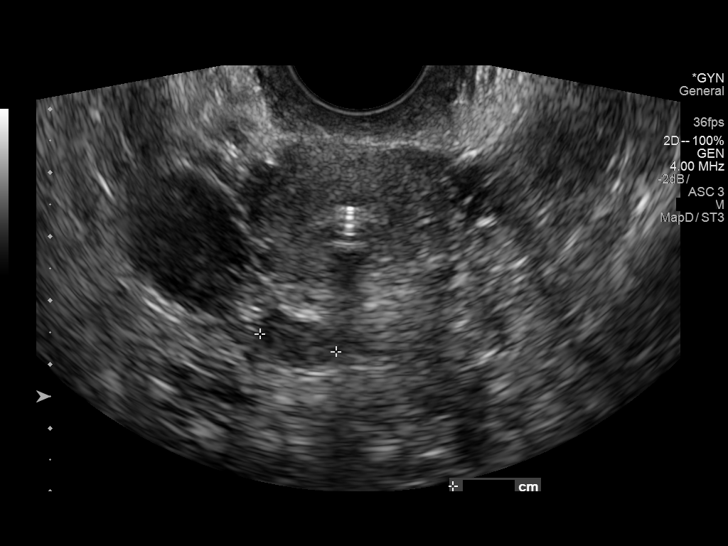
[im 29/57]
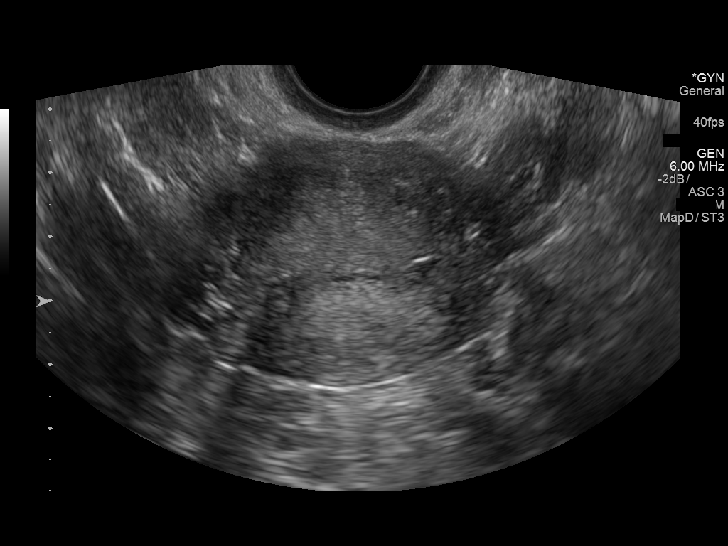
[im 33/57]
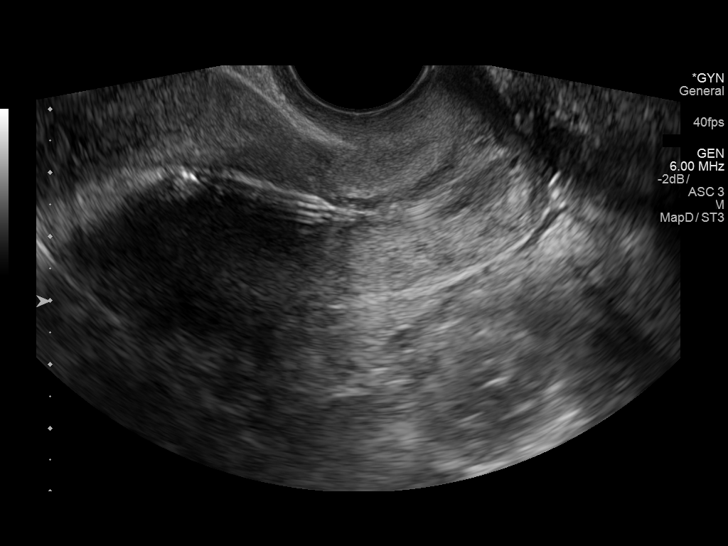
[im 38/57]
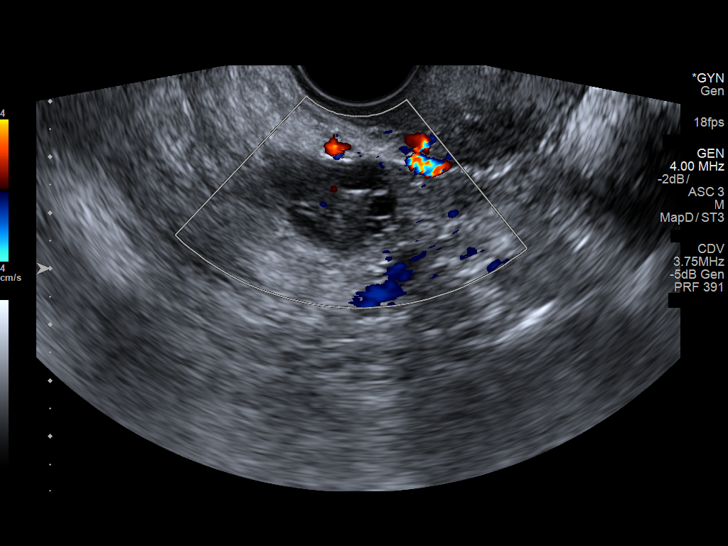
[im 43/57]
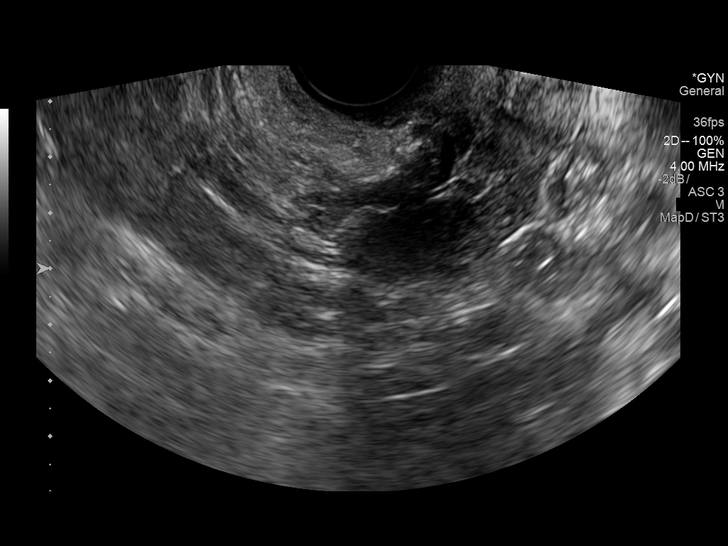
[im 47/57]
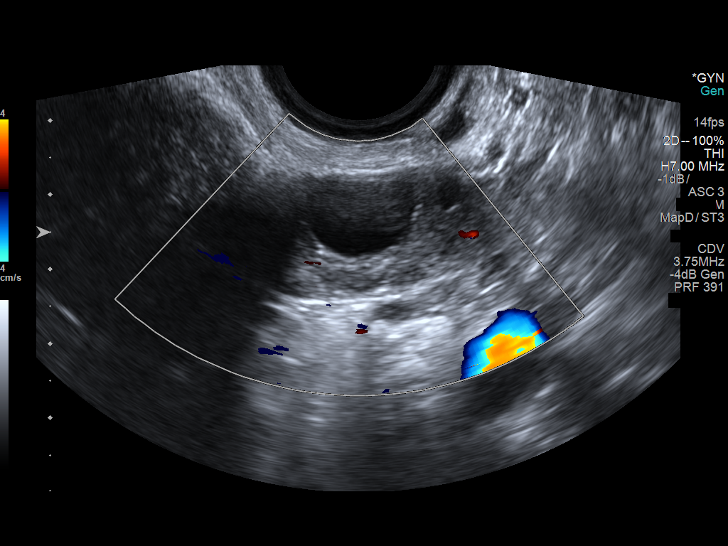
[im 52/57]
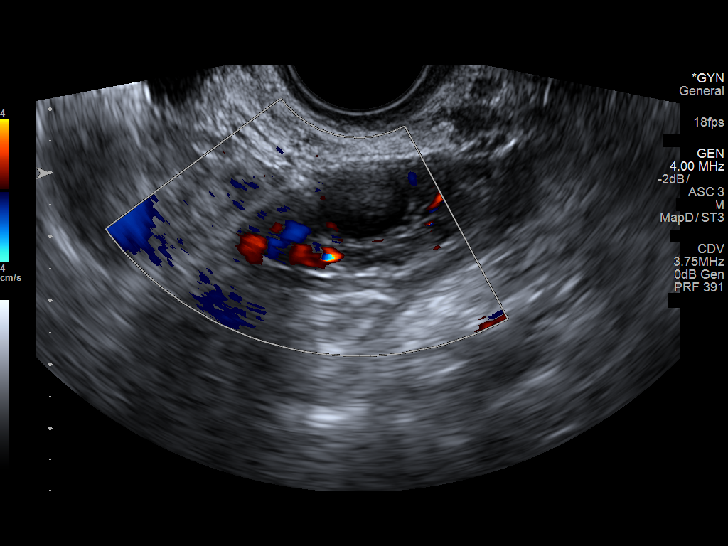
[im 57/57]
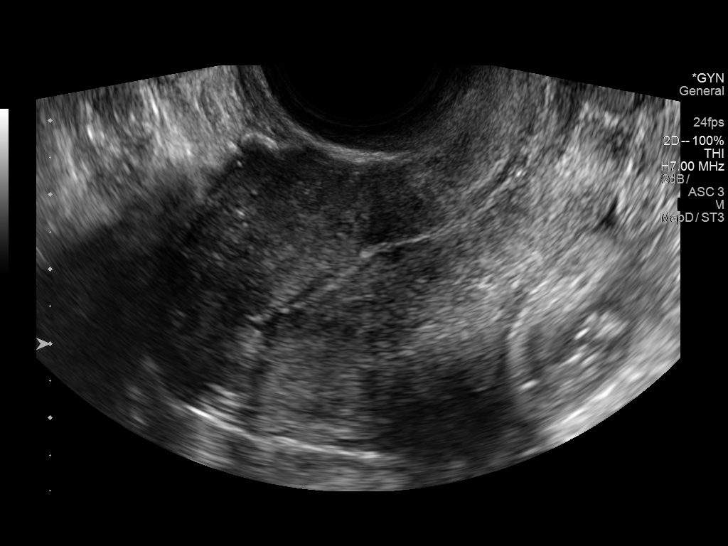

[13 of 25 positions shown; findings below may reference images not displayed]

FINDINGS: Uterus

Measurements: 8.2 x 4.1 x 5.0 cm. Two fibroids are seen in the
uterus. The first is seen to the left and posteriorly measuring
x 5.3 x 2.2 cm. The smaller posterior fibroid measures 1.0 x 0.8 x
1.2 cm.

Endometrium

Thickness: 4.9 mm. IUD centrally located in the endometrial canal
near the fundus.

Right ovary

Measurements: 2.5 x 1.8 x 2.0 cm. Normal appearance/no adnexal mass.

Left ovary

Measurements: 3.5 x 1.7 x 2.6 cm. Normal appearance/no adnexal mass.

Other findings

No abnormal free fluid.
IMPRESSION: 1. The IUD appears to be in good position.
2. Two fibroids in the uterus. The larger of the 2 fibroids appears
to be larger on today's study measuring up to 5.3 cm today versus
1.5 cm previously.
3. No other abnormalities.
